# Patient Record
Sex: Female | Born: 1975 | Race: White | Hispanic: No | Marital: Married | Smoking: Never smoker
Health system: Southern US, Community
[De-identification: ages and names within clinical notes are randomized; demographics above are authoritative.]

## PROBLEM LIST (undated history)

## (undated) DIAGNOSIS — Z789 Other specified health status: Secondary | ICD-10-CM

## (undated) HISTORY — PX: TONSILLECTOMY: SUR1361

## (undated) HISTORY — DX: Other specified health status: Z78.9

---

## 2005-11-04 ENCOUNTER — Encounter: Payer: Self-pay | Admitting: Family Medicine

## 2005-11-04 ENCOUNTER — Ambulatory Visit: Payer: Self-pay | Admitting: Family Medicine

## 2006-03-15 ENCOUNTER — Ambulatory Visit: Payer: Self-pay | Admitting: Family Medicine

## 2006-06-22 ENCOUNTER — Ambulatory Visit: Payer: Self-pay | Admitting: Family Medicine

## 2006-08-04 ENCOUNTER — Ambulatory Visit: Payer: Self-pay | Admitting: Internal Medicine

## 2006-08-16 ENCOUNTER — Encounter: Payer: Self-pay | Admitting: Internal Medicine

## 2006-08-16 ENCOUNTER — Ambulatory Visit: Payer: Self-pay

## 2006-10-27 ENCOUNTER — Ambulatory Visit: Payer: Self-pay | Admitting: Family Medicine

## 2007-01-12 ENCOUNTER — Ambulatory Visit: Payer: Self-pay | Admitting: Family Medicine

## 2007-02-15 ENCOUNTER — Ambulatory Visit: Payer: Self-pay | Admitting: Internal Medicine

## 2007-02-15 DIAGNOSIS — J019 Acute sinusitis, unspecified: Secondary | ICD-10-CM | POA: Insufficient documentation

## 2007-02-24 ENCOUNTER — Ambulatory Visit: Payer: Self-pay | Admitting: Family Medicine

## 2007-03-26 ENCOUNTER — Ambulatory Visit: Payer: Self-pay | Admitting: Family Medicine

## 2007-03-26 DIAGNOSIS — J209 Acute bronchitis, unspecified: Secondary | ICD-10-CM | POA: Insufficient documentation

## 2007-03-30 ENCOUNTER — Encounter: Admission: RE | Admit: 2007-03-30 | Discharge: 2007-03-30 | Payer: Self-pay | Admitting: Family Medicine

## 2007-04-02 ENCOUNTER — Encounter (INDEPENDENT_AMBULATORY_CARE_PROVIDER_SITE_OTHER): Payer: Self-pay | Admitting: *Deleted

## 2007-04-02 ENCOUNTER — Telehealth (INDEPENDENT_AMBULATORY_CARE_PROVIDER_SITE_OTHER): Payer: Self-pay | Admitting: *Deleted

## 2007-07-24 ENCOUNTER — Telehealth (INDEPENDENT_AMBULATORY_CARE_PROVIDER_SITE_OTHER): Payer: Self-pay | Admitting: *Deleted

## 2007-07-25 ENCOUNTER — Ambulatory Visit: Payer: Self-pay | Admitting: Internal Medicine

## 2007-07-25 DIAGNOSIS — K5289 Other specified noninfective gastroenteritis and colitis: Secondary | ICD-10-CM | POA: Insufficient documentation

## 2007-07-25 LAB — CONVERTED CEMR LAB
Beta hcg, urine, semiquantitative: NEGATIVE
Bilirubin Urine: NEGATIVE
Glucose, Urine, Semiquant: NEGATIVE
Specific Gravity, Urine: >=1.03
Urobilinogen, UA: NEGATIVE
pH: 5

## 2007-07-26 ENCOUNTER — Encounter: Payer: Self-pay | Admitting: Internal Medicine

## 2008-04-22 ENCOUNTER — Ambulatory Visit: Payer: Self-pay | Admitting: Family Medicine

## 2008-04-22 LAB — CONVERTED CEMR LAB: Beta hcg, urine, semiquantitative: POSITIVE

## 2008-12-30 ENCOUNTER — Inpatient Hospital Stay (HOSPITAL_COMMUNITY): Admission: AD | Admit: 2008-12-30 | Discharge: 2009-01-02 | Payer: Self-pay | Admitting: Obstetrics and Gynecology

## 2008-12-30 ENCOUNTER — Encounter (INDEPENDENT_AMBULATORY_CARE_PROVIDER_SITE_OTHER): Payer: Self-pay | Admitting: Obstetrics and Gynecology

## 2010-08-05 ENCOUNTER — Inpatient Hospital Stay (HOSPITAL_COMMUNITY): Admission: AD | Admit: 2010-08-05 | Payer: Self-pay | Admitting: Obstetrics and Gynecology

## 2010-08-11 ENCOUNTER — Inpatient Hospital Stay (HOSPITAL_COMMUNITY)
Admission: RE | Admit: 2010-08-11 | Discharge: 2010-08-14 | Payer: Self-pay | Source: Home / Self Care | Attending: Obstetrics and Gynecology | Admitting: Obstetrics and Gynecology

## 2010-09-24 NOTE — Discharge Summary (Signed)
  [name], [name]              ACCOUNT NO.:  [id number]  MEDICAL RECORD NO.:  [id number]          PATIENT TYPE:  INP  LOCATION:  9103                          [facility]:  [facility]  PHYSICIAN:  Roxy Filler L. Jhoselin Crume, M.D.DATE OF BIRTH:  Mar 06, 1976  DATE OF ADMISSION:  08/11/2010 DATE OF DISCHARGE:  08/13/2010                              DISCHARGE SUMMARY   ADMITTING DIAGNOSES: 1. Intrauterine pregnancy at 19 weeks' estimated gestational age. 2. Previous cesarean section.  DISCHARGE DIAGNOSES: 1. Status post low transverse cesarean section. 2. Viable female infant.  PROCEDURE:  Repeat low transverse cesarean section.  REASON FOR ADMISSION:  Please see written H and P.  HOSPITAL COURSE:  The patient is a 35 year old gravida 2, para 1, who was admitted to [facility] at 39 plus weeks' estimated gestational age for scheduled cesarean section.  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  On the morning of admission, the patient was taken to the operating room where spinal anesthesia was administered without difficulty.  Low transverse incision was made with a delivery of a viable female infant weighing 6 pounds 12 ounces with Apgars of 9 at 1 minute and 9 at 5 minutes.  The patient tolerated procedure well, was taken to recovery room in stable condition.  On postoperative day #1, the patient was without complaint. Vital signs were stable.  Abdomen soft with good return of bowel function.  Fundus was firm and nontender.  Incision was clean, dry, and intact with subcuticular closure and Dermabond.  Laboratories were pending.  On postoperative day #2, the patient was considering early discharge.  Vital signs were stable.  She was afebrile.  Abdomen soft. Fundus firm and nontender.  Incision was clean, dry, and intact. Laboratory findings revealed hemoglobin of 10.9.  The patient was  noted to be Rh negative and she had received RhoGAM.  DISCHARGE INSTRUCTIONS:  Were reviewed and patient was later discharged home.  CONDITION ON DISCHARGE:  Stable.  DIET:  Regular as tolerated.  ACTIVITY:  No heavy lifting, no driving x2 weeks, no vaginal entry.  FOLLOWUP:  Patient to follow up in the office in 1-2 weeks for an incision check.  She is to call for temperature greater than 100 degrees, persistent nausea, vomiting, heavy vaginal bleeding, and/or redness or drainage from the incisional site.  DISCHARGE MEDICATIONS: 1. Tylox #30 one p.o. every 4-6 hours p.r.n. 2. Motrin 600 mg every 6 hours. 3. Prenatal vitamins one p.o. daily. 4. Colace 1 p.o. daily p.r.n.     Julio Sicks, N.P.   ______________________________ Stann Mainland. Vincente Poli, M.D.    CC/MEDQ  D:  09/07/2010  T:  09/08/2010  Job:  [id number]  Electronically Signed by Julio Sicks N.P. on 09/08/2010 11:58:56 AM Electronically Signed by Marcelle Overlie M.D. on 09/24/2010 08:43:21 AM

## 2010-11-16 LAB — CBC
HCT: 34.9 % — ABNORMAL LOW (ref 36.0–46.0)
Hemoglobin: 12.1 g/dL (ref 12.0–15.0)
MCH: 33.8 pg (ref 26.0–34.0)
MCHC: 34.7 g/dL (ref 30.0–36.0)
MCV: 96.8 fL (ref 78.0–100.0)
MCV: 98 fL (ref 78.0–100.0)
Platelets: 144 10*3/uL — ABNORMAL LOW (ref 150–400)
RDW: 13.7 % (ref 11.5–15.5)

## 2010-11-16 LAB — COMPREHENSIVE METABOLIC PANEL
ALT: 15 U/L (ref 0–35)
AST: 20 U/L (ref 0–37)
CO2: 24 mEq/L (ref 19–32)
Chloride: 105 mEq/L (ref 96–112)
Creatinine, Ser: 0.8 mg/dL (ref 0.4–1.2)
GFR calc Af Amer: >60 mL/min (ref >60)
GFR calc non Af Amer: >60 mL/min (ref >60)
Sodium: 137 mEq/L (ref 135–145)
Total Bilirubin: 0.3 mg/dL (ref 0.3–1.2)

## 2010-11-16 LAB — ABO/RH: ABO/RH(D): A NEG

## 2010-11-16 LAB — RH IMMUNE GLOB WKUP(>/=20WKS)(NOT WOMEN'S HOSP)

## 2010-12-15 LAB — RH IMMUNE GLOB WKUP(>/=20WKS)(NOT WOMEN'S HOSP)

## 2010-12-15 LAB — CBC
HCT: 27.4 % — ABNORMAL LOW (ref 36.0–46.0)
Hemoglobin: 12.4 g/dL (ref 12.0–15.0)
MCHC: 35.4 g/dL (ref 30.0–36.0)
MCV: 96.9 fL (ref 78.0–100.0)
Platelets: 135 10*3/uL — ABNORMAL LOW (ref 150–400)
RBC: 2.58 MIL/uL — ABNORMAL LOW (ref 3.87–5.11)
RBC: 2.83 MIL/uL — ABNORMAL LOW (ref 3.87–5.11)
RBC: 3.6 MIL/uL — ABNORMAL LOW (ref 3.87–5.11)
RDW: 12.7 % (ref 11.5–15.5)
WBC: 13.1 10*3/uL — ABNORMAL HIGH (ref 4.0–10.5)

## 2011-01-18 NOTE — Op Note (Signed)
NAMEJONATHAN, KIRKENDOLL              ACCOUNT NO.:  [id number]   MEDICAL RECORD NO.:  [id number]          PATIENT TYPE:  INP   LOCATION:  9145                          [facility]   PHYSICIAN:  Michelle L. Grewal, M.D.DATE OF BIRTH:  Dec 09, 1975   DATE OF PROCEDURE:  12/30/2008  DATE OF DISCHARGE:                               OPERATIVE REPORT   PREOPERATIVE DIAGNOSES:  1. Intrauterine pregnancy at 41 weeks.  2. Arrest to dilation is 6 cm in transverse position.   POSTOPERATIVE DIAGNOSES:  1. Intrauterine pregnancy at 41 weeks.  2. Arrest to dilation is 6 cm in transverse position.   PROCEDURE:  Primary low transverse cesarean section.   SURGEON:  Michelle L. Grewal, MD   ANESTHESIA:  Epidural.   FINDINGS:  Female infant, Apgars 9 at 1 minute and 9 at 5 minutes in left  occipitotransverse position.   ESTIMATED BLOOD LOSS:  500 mL.   COMPLICATIONS:  None.   PROCEDURE:  The patient was taken to the operating room.  After she had  been consented for the known risk associated with the procedure, her  husband had been counseled as well.  All questions were answered.  After  she was taken to the operating room, she was dosed by Anesthesia and  found to be adequate.  She was prepped and draped in the usual sterile  fashion.  A Foley catheter had been inserted while in labor and  delivery.  A low transverse incision was made in the skin and carried  down to the fascia.  Fascia was scored in the midline and extended  laterally.  The rectus muscles were separated in the midline and the  peritoneum was entered bluntly and then the peritoneal incision was then  stretched.  The bladder blade was inserted.  The lower uterine segment  was identified and the bladder flap was created sharply and then  digitally and the bladder blade was readjusted.  A low transverse  incision was made in the uterus.  The uterus was entered using a  hemostat.  The baby was in LOT position, and the baby was  delivered  easily with one gentle pull of the vacuum.  The baby was a female infant,  vigorous on the abdomen, Apgars 9 at 1 minute and 9 at 5 minutes.  There  was a tight shoulder cord noted as well.  I thought this as umbilical  cord, appeared very long and thin as well.  The cord was clamped and  cut.  The baby was handed to the awaiting neonatal team and subsequently  taken to the nursery.  The placenta was manually removed and noted to be  normal and intact with a 3-vessel cord.  The Pitocin and antibiotics  were given.  The uterus was cleared of all clots and debris.  The  uterine incision was closed in 1 layer using 0 chromic in a running  locked stitch.  The uterus was returned to the abdomen.  Irrigation was  performed.  The peritoneum and rectus muscles were reapproximated using  0 Vicryl.  The fascia was closed using  0 Vicryl in running stitch.  After irrigation of subcutaneous layer, the skin was closed with  staples.  All sponge, lap, and instrument counts were correct x2.  The  patient went to recovery room in stable condition.      Michelle L. Vincente Poli, M.D.  Electronically Signed     MLG/MEDQ  D:  12/30/2008  T:  12/31/2008  Job:  [id number]

## 2011-01-20 ENCOUNTER — Encounter: Payer: Self-pay | Admitting: Family Medicine

## 2011-01-20 ENCOUNTER — Ambulatory Visit: Payer: Self-pay | Admitting: Family Medicine

## 2011-01-21 NOTE — Group Therapy Note (Signed)
NAME:  Beverly Banks, Beverly Banks              ACCOUNT NO.:  [id number]   MEDICAL RECORD NO.:  [id number]          PATIENT TYPE:  WOC   LOCATION:  [facility]                   FACILITY:  [facility]   PHYSICIAN:  Tinnie Gens, MD        DATE OF BIRTH:  [birth year]   DATE OF SERVICE:                                    CLINIC NOTE   CHIEF COMPLAINT:  Annual exam.   HISTORY OF PRESENT ILLNESS:  Patient is a 35 year old gravida 0, who was  recently married and tried to conceive sometime in the summer.  She works as  an Product/process development scientist currently and is without significant complaint today.  She states  that she has regular cycles.  She has been using condoms for birth control  previously.   PAST MEDICAL HISTORY:  Significant for hepatitis A as a child and pneumonia.   SURGICAL HISTORY:  Negative.   MEDICATIONS:  None.   ALLERGIES:  1.  NO KNOWN DRUG ALLERGIES.  2.  SHE IS ALLERGIC TO CUCUMBERS AND ORANGES, AND CHOCOLATE AND COFFEE,      WHICH ALL GIVE HER A RASH.   OBSTETRIC HISTORY:  She is a G0.   GYNECOLOGIC HISTORY:  Menarche at age 65, cycles are every month, last 3-4  days, medium flow and mild cramping the first day.  She has never had a  pelvic exam before and has no Pap smear history.  She has no STD history  although her husband has HSV and is on prophylaxis for outbreaks.   FAMILY HISTORY:  Significant for colon, breast and lung cancer, which is all  on the maternal side of the family.  She believes her mother recently had  ovarian cancer, as well.  Her family is in [place].   SOCIAL HISTORY:  She is married.  She has a 2-1/2-year-old stepson, who  lives with her.  She denies tobacco or alcohol use.   REVIEW OF SYSTEMS:  A 14-point review of systems is reviewed.  See GYN  history in the chart, but is negative except for pain with intercourse,  which she described as just being pain in certain positions and only with  deep penetration.  It is quick goes away quickly, as well.   PHYSICAL  EXAMINATION:  VITAL SIGNS:  On exam today, her blood pressure is  105/61 and weight is 127.  GENERAL: She is a well-developed, well-nourished white female in no acute  distress.  NECK:  Supple with normal thyroid.  LUNGS:  Clear bilaterally.  CARDIOVASCULAR:  Regular rate and rhythm with no rubs, gallops or murmurs.  ABDOMEN:  Soft, nontender and nondistended.  BREASTS:  Symmetric with everted nipples.  She has fibrocystic change in the  outer quadrants bilaterally, but no definitive mass.  There is no  supraclavicular or axillary adenopathy.  GENITOURINARY:  Normal external female genitalia.  BUS is normal.  The  vagina is pink and rugated.  The cervix is nulliparous and without lesion.  The uterus was small and anteverted.  The adnexa were without mass or  tenderness.   IMPRESSION:  Annual exam.   PLAN:  1.  Pap smear today.  2.  GC and Chlamydia testing.  3.  The patient will call us when and if she becomes pregnant, or if we can      do anything else for her GYN care.  Otherwise, follow up will be in 1      year for another Pap smear.           ______________________________  Tinnie Gens, MD     TP/MEDQ  D:  11/04/2005  T:  11/05/2005  Job:  [id number]

## 2011-01-21 NOTE — Discharge Summary (Signed)
Beverly Banks, Beverly Banks              ACCOUNT NO.:  [id number]   MEDICAL RECORD NO.:  [id number]          PATIENT TYPE:  INP   LOCATION:  9145                          [facility]   PHYSICIAN:  Freddy Finner, M.D.   DATE OF BIRTH:  1976-05-11   DATE OF ADMISSION:  12/30/2008  DATE OF DISCHARGE:  01/02/2009                               DISCHARGE SUMMARY   ADMITTING DIAGNOSES:  1. Intrauterine pregnancy at 40+ weeks estimated gestational age.  2. Postdate induction of labor.   DISCHARGE DIAGNOSES:  1. Status post low transverse cesarean section secondary to failure to      progress.  2. Viable female infant.   PROCEDURE:  Primary low transverse cesarean section.   REASON FOR ADMISSION:  Please see written H&P.   HOSPITAL COURSE:  The patient is a 35 year old primigravida was admitted  to [facility] for postdate induction of labor.  On  admission, vital signs were stable.  Contractions were noted to be  irregular on admission.  Group B beta strep culture was negative and the  patient was known to be Rh negative.  Fetal heart tones were reactive.  Cervix was examined and found to be 2-1/2 cm dilated, 80% effaced,  vertex at a -2 station.  Artificial rupture of membranes was performed  which revealed clear fluid.  The patient was now started on Pitocin to  augment her labor and epidural was placed for her comfort.  Over the  next several hours, the patient was noted to be in a good pattern of  labor.  Intrauterine pressure catheter had revealed adequate uterine  contractions.  However, cervix continued to be 5-6 cm without further  change in cervix.  Decision was then made to proceed with a primary low  transverse cesarean section.  The patient was then transferred to the  operating room where an epidural was dosed to an adequate surgical  level.  A low transverse incision was made with delivery of a viable  female infant with Apgars of 9 at 1 minute and 9 at 5  minutes.  The  patient tolerated the procedure well and was taken to the recovery room  in stable condition.  On postoperative day #1, the patient was without  complaint.  Vital signs were stable.  Temperature max was 100.1 on the  evening of her surgery.  Temperature currently at the time of rounding  was 99.  Abdomen soft with some decrease in bowel sounds.  Fundus was  firm.  Abdominal dressing noted to have a scant amount of drainage on  the bandage.  Foley was draining with adequate amounts of urine output.  Laboratory findings revealed hemoglobin of 9.7 and WBC count of 13.1.  on postoperative day #2, the patient was without complaint.  Vital signs  were stable.  She was afebrile.  Fundus firm and nontender.  Incision  was noted to have some ecchymosis noted superior and inferior to the  incisional site.  Laboratory findings revealed hemoglobin of 8.9,  platelet count of 135,000, WBC count of 10.8.  On postoperative day #3,  the  patient was without complaint.  Vital signs were stable.  She was  afebrile.  Abdomen was soft.  Fundus firm and nontender.  Incision was  noted to have a small amount of oozing noted from the left margin of the  incision.  Ecchymosis continued to be noted superior and inferior to the  incisional site.  Steri-Strips were applied.  Staples were left in  place.  Discharge instructions were reviewed and the patient was later  discharged to home.   CONDITION ON DISCHARGE:  Stable.   DIET:  Regular as tolerated.   ACTIVITY:  No heavy lifting, no driving x2 weeks, no vaginal entry.   FOLLOWUP:  The patient to follow up in the office in 2-3 days for staple  removal.  She is to call for temperature greater than 100 degrees,  persistent nausea, vomiting, heavy vaginal bleeding and/or redness or  further drainage from the incisional site.   DISCHARGE MEDICATIONS:  1. Percocet 5/325, #30 one p.o. every 4-6 hours p.r.n.  2. Motrin 600 mg every 6 hours.  3.  Prenatal vitamins 1 p.o. daily.      Julio Sicks, N.P.      Freddy Finner, M.D.  Electronically Signed    CC/MEDQ  D:  01/14/2009  T:  01/14/2009  Job:  [id number]

## 2011-01-24 ENCOUNTER — Ambulatory Visit (INDEPENDENT_AMBULATORY_CARE_PROVIDER_SITE_OTHER): Payer: No Typology Code available for payment source | Admitting: Family Medicine

## 2011-01-24 ENCOUNTER — Encounter: Payer: Self-pay | Admitting: Family Medicine

## 2011-01-24 VITALS — BP 90/60 | HR 74 | Temp 99.0°F | Wt 134.0 lb

## 2011-01-24 DIAGNOSIS — B351 Tinea unguium: Secondary | ICD-10-CM

## 2011-01-24 NOTE — Progress Notes (Signed)
  Subjective:    Patient ID: Beverly Banks, female    DOB: August 25, 1976, 35 y.o.   MRN: [id number]  HPI Pt here c/o fungus on R 3rd finger.  No other complaints.     Review of Systems As above    Objective:   Physical Exam  Constitutional: She is oriented to person, place, and time. She appears well-developed and well-nourished.  Musculoskeletal:       Right 3rd middle finger---nail deformed and thick  Neurological: She is alert and oriented to person, place, and time.  Psychiatric: She has a normal mood and affect. Her behavior is normal.          Assessment & Plan:

## 2011-01-24 NOTE — Patient Instructions (Signed)
Ringworm - Nail (Tinea Unguium/Onychomycosis) A fungal infection of the nail (tinea unguium/onychomycosis) is common. It is common as the visible part of the nail is composed of dead cells which have no blood supply to help prevent infection. It occurs because fungi are everywhere and will pick any opportunity to grow on any dead material. Because nails are very slow growing they require up to 2 years of treatment with anti-fungal medications. The entire nail back to the base is infected. This includes approximately ? of the nail which you cannot even see. If your caregiver has prescribed a medication by mouth, take it every day and as directed. No progress will be seen for at least 6 to 9 months. Do not be disappointed! Because fungi live on dead cells with little or no exposure to blood supply, medication delivery to the infection is slow; thus the cure is slow. It is also why you can observe no progress in the first 6 months. The nail becoming cured is the base of the nail, as it has the blood supply. Topical medication such as creams and ointments are usually not effective. Important in successful treatment of nail fungus is closely following the medication regimen that your doctor prescribes. Sometimes you and your caregiver may elect to speed up this process by surgical removal of all the nails. Even this may still require 6 to 9 months of additional oral medications. See your caregiver as directed. Remember there will be no visible improvement for at least 6 months. See your caregiver sooner if other signs of infection (redness and swelling) develop. Document Released: 08/19/2000 Document Re-Released: 02/09/2010 [employer] Patient Information 2011 [employer]. 

## 2011-01-24 NOTE — Assessment & Plan Note (Signed)
Unable to treat secondary to pt breast feeding rto when she is finished breast feeding and we can discuss options

## 2011-04-06 ENCOUNTER — Telehealth: Payer: Self-pay | Admitting: *Deleted

## 2011-04-06 ENCOUNTER — Encounter: Payer: Self-pay | Admitting: Family Medicine

## 2011-04-06 ENCOUNTER — Ambulatory Visit (INDEPENDENT_AMBULATORY_CARE_PROVIDER_SITE_OTHER): Payer: No Typology Code available for payment source | Admitting: Family Medicine

## 2011-04-06 VITALS — BP 102/60 | HR 63 | Temp 98.3°F | Wt 132.8 lb

## 2011-04-06 DIAGNOSIS — L03019 Cellulitis of unspecified finger: Secondary | ICD-10-CM

## 2011-04-06 DIAGNOSIS — IMO0001 Reserved for inherently not codable concepts without codable children: Secondary | ICD-10-CM | POA: Insufficient documentation

## 2011-04-06 MED ORDER — VALACYCLOVIR HCL 1 G PO TABS: 1000.0000 mg | ORAL_TABLET | Freq: Every day | ORAL | Status: AC

## 2011-04-06 MED ORDER — AMOXICILLIN 500 MG PO CAPS: 500.0000 mg | ORAL_CAPSULE | Freq: Two times a day (BID) | ORAL | Status: AC

## 2011-04-06 NOTE — Telephone Encounter (Signed)
Discuss with patient, appt schedule for today.

## 2011-04-06 NOTE — Patient Instructions (Signed)
This is an infection- take the Amoxicillin as directed, take w/ food to avoid upset stomach Try and keep your hands clean and dry You can do Epsom Salt soaks in warm water for 15-20 minutes 3x/day Someone will call you with your derm appt Hang in there!

## 2011-04-06 NOTE — Assessment & Plan Note (Signed)
Pt still w/ fungal infxn of nail but now has paronychia due to split nail bed.  Start Amox since pt is breast feeding.  Will refer to derm for tx of nail fungus.  Reviewed supportive care and red flags that should prompt return.  Pt expressed understanding and is in agreement w/ plan.

## 2011-04-06 NOTE — Telephone Encounter (Signed)
Pt still c/o redness, nail turning a yellowish color, tenderness and pain around nail base. Pt denies any drainage,or fever. Pt notes that she is currently breast feeding so any med Rx will need to be approved with her pediatrician first. Pt seen on 01-24-11 for nail fungus.Please advise    Pt uses [facility].

## 2011-04-06 NOTE — Telephone Encounter (Signed)
If there is pain and redness around the nail bed it sounds as if she now has bacterial infxn- nail fungus is usually painless.  Needs to be seen for this so that it can be treated appropriately- these often need to be drained

## 2011-04-06 NOTE — Progress Notes (Signed)
  Subjective:    Patient ID: Beverly Banks, female    DOB: 05/14/1976, 35 y.o.   MRN: [id number]  HPI R middle finger pain- sxs started a couple of months ago w/ an 'indentation of the nail'.  Was dx'd w/ fungal infxn but was not treated b/c of breast feeding.  2 days ago area became red and painful.  Pt thinks there is pus- no drainage from finger.   Review of Systems For ROS see HPI     Objective:   Physical Exam  Vitals reviewed. Constitutional: She appears well-developed and well-nourished. No distress.  Skin: Skin is warm. There is erythema.       R middle finger w/ erythema and induration around base of nail bed, pus expressed from ulnar nail margin.          Assessment & Plan:

## 2011-10-13 ENCOUNTER — Encounter: Payer: Self-pay | Admitting: Family Medicine

## 2011-10-13 ENCOUNTER — Ambulatory Visit (INDEPENDENT_AMBULATORY_CARE_PROVIDER_SITE_OTHER): Payer: Managed Care, Other (non HMO) | Admitting: Family Medicine

## 2011-10-13 VITALS — BP 96/62 | HR 86 | Temp 98.8°F | Wt 133.6 lb

## 2011-10-13 DIAGNOSIS — J329 Chronic sinusitis, unspecified: Secondary | ICD-10-CM

## 2011-10-13 MED ORDER — BUDESONIDE 32 MCG/ACT NA SUSP: NASAL | Status: DC

## 2011-10-13 MED ORDER — AMOXICILLIN 875 MG PO TABS: 875.0000 mg | ORAL_TABLET | Freq: Two times a day (BID) | ORAL | Status: DC

## 2011-10-13 NOTE — Progress Notes (Signed)
  Subjective:     SALLEE HOGREFE is a 36 y.o. female who presents for evaluation of sinus pain. Symptoms include: congestion, cough, facial pain, nasal congestion, post nasal drip, sinus pressure and chills. Onset of symptoms was 4 days ago. Symptoms have been gradually worsening since that time. Past history is significant for no history of pneumonia or bronchitis. Patient is a non-smoker. She is breast feeding.    The following portions of the patient's history were reviewed and updated as appropriate: allergies, current medications, past family history, past medical history, past social history, past surgical history and problem list.  Review of Systems Pertinent items are noted in HPI.   Objective:    BP 96/62  Pulse 86  Temp(Src) 98.8 F (37.1 C) (Oral)  Wt 133 lb 9.6 oz (60.601 kg)  SpO2 98% General appearance: alert, cooperative, appears stated age and moderate distress Ears: tm dull b/l Nose: green discharge, moderate congestion, turbinates red, swollen, edematous, sinus tenderness bilateral Throat: abnormal findings: mild oropharyngeal erythema Neck: no adenopathy, supple, symmetrical, trachea midline and thyroid not enlarged, symmetric, no tenderness/mass/nodules Lungs: clear to auscultation bilaterally    Assessment:    Acute bacterial sinusitis.    Plan:    Nasal steroids per medication orders. Antihistamines per medication orders. Amoxicillin per medication orders. claritin and rhinocort

## 2011-10-13 NOTE — Patient Instructions (Signed)

## 2011-10-17 ENCOUNTER — Other Ambulatory Visit: Payer: Self-pay | Admitting: *Deleted

## 2011-10-17 ENCOUNTER — Telehealth: Payer: Self-pay | Admitting: Family Medicine

## 2011-10-17 MED ORDER — SULFAMETHOXAZOLE-TRIMETHOPRIM 800-160 MG PO TABS: 1.0000 | ORAL_TABLET | Freq: Two times a day (BID) | ORAL | Status: AC

## 2011-10-17 MED ORDER — FEXOFENADINE HCL 180 MG PO TABS: 180.0000 mg | ORAL_TABLET | Freq: Every day | ORAL | Status: DC

## 2011-10-17 NOTE — Telephone Encounter (Signed)
[facility] Triage Call Report Triage Record Num: [id number] Operator: Tomasita Crumble Patient Name: Beverly Banks Call Date & Time: 10/17/2011 9:57:35AM Patient Phone: [phone] PCP: Lelon Perla Patient Gender: Female PCP Fax : [phone] Patient DOB: 1975/12/04 Practice Name: [facility] Reason for Call: Caller: Beverly Banks/Patient; PCP: Lelon Perla.; CB#: [id number]; Call regarding Nasal Congestion; onset 10/10/11 w/ right earache. Afebrile/subjective. Nasal drainage white; does not feel that her nasal sx have improved and she still has ear stuffiness. Advised see in 24 hours per Allergies protocol. Caller agreed to appointment, but states she needs to arrange child care. She will call scheduler for appontment. Protocol(s) Used: Allergies Recommended Outcome per Protocol: See Provider within 24 hours Reason for Outcome: Being treated by provider for allergies AND symptoms not responding or are worsening with prescribed treatment in expected timeframe Care Advice: ~ Continue taking prescribed medications as directed until provider is consulted. Avoid being outdoors during periods of high pollution or high pollen counts. Stay in a filtered, air conditioned environment. ~ Use an air purifier, clean or change filters on heating/cooling units at least monthly. Maintaining humidity in home below 50%, helps prevent dust mites and mold. ~ ~ Contact provider during regular office hours to discuss treatment plan. Avoid exposure to environmental irritants. Do not smoke and avoid second-hand smoke. Avoid outside activities on high pollution days. Instead of strong smelling commercial cleaning products, substitute vinegar and lemon juice. ~ ~ CAUTIONS Most adults need to drink 6-10 eight-ounce glasses (1.2-2.0 liters) of fluids per day unless previously told to limit fluid intake for other medical reasons. Limit fluids that contain caffeine, sugar or alcohol. Urine  will be a very light yellow color when you drink enough fluids.

## 2011-10-17 NOTE — Telephone Encounter (Signed)
Rx faxed and patient is aware    KP

## 2011-10-17 NOTE — Telephone Encounter (Signed)
Discussed with patient and Rx has been faxed   KP 

## 2011-10-17 NOTE — Telephone Encounter (Signed)
PT left VM that she would like to get refill on Allegra. Med not on med list and never Rx in system .Please advise

## 2011-10-17 NOTE — Telephone Encounter (Signed)
Allegra 180 mg #30, 1 po qd 1 1 refils

## 2011-10-17 NOTE — Telephone Encounter (Signed)
Change to bactrim ds  1 po bid for 10 days---ov if no better

## 2011-10-18 ENCOUNTER — Ambulatory Visit: Payer: Managed Care, Other (non HMO) | Admitting: Family Medicine

## 2011-12-15 ENCOUNTER — Other Ambulatory Visit: Payer: Self-pay

## 2012-01-09 ENCOUNTER — Ambulatory Visit (INDEPENDENT_AMBULATORY_CARE_PROVIDER_SITE_OTHER): Payer: Managed Care, Other (non HMO) | Admitting: Family Medicine

## 2012-01-09 ENCOUNTER — Encounter: Payer: Self-pay | Admitting: Family Medicine

## 2012-01-09 VITALS — BP 100/68 | HR 85 | Temp 98.1°F | Wt 133.0 lb

## 2012-01-09 DIAGNOSIS — H6690 Otitis media, unspecified, unspecified ear: Secondary | ICD-10-CM

## 2012-01-09 DIAGNOSIS — H6691 Otitis media, unspecified, right ear: Secondary | ICD-10-CM

## 2012-01-09 MED ORDER — SULFAMETHOXAZOLE-TRIMETHOPRIM 800-160 MG PO TABS: 1.0000 | ORAL_TABLET | Freq: Two times a day (BID) | ORAL | Status: DC

## 2012-01-09 NOTE — Progress Notes (Signed)
  Subjective:     Beverly Banks is a 36 y.o. female who presents with ear pain and possible ear infection. Symptoms include: bilateral ear pain, congestion and plugged sensation in both ears. Onset of symptoms was 4 days ago, and have been gradually worsening since that time. Associated symptoms include: congestion, productive cough and sinus pressure.  Patient denies: achiness, chills, fever , headache and low grade fever. She is drinking plenty of fluids.  Taking Tussin DM , afrin and IB with little relief.   The following portions of the patient's history were reviewed and updated as appropriate: allergies, current medications, past family history, past medical history, past social history, past surgical history and problem list.  Review of Systems Pertinent items are noted in HPI.   Objective:    BP 100/68  Pulse 85  Temp(Src) 98.1 F (36.7 C) (Oral)  Wt 133 lb (60.328 kg)  SpO2 98% General:  alert, cooperative, appears stated age and no distress  Right Ear: diminished mobility-  + errythema  Left Ear: normal  Mouth:  abnormal findings: mild oropharyngeal erythema and pnd  Neck: no adenopathy, supple, symmetrical, trachea midline and thyroid not enlarged, symmetric, no tenderness/mass/nodules     Assessment:    Right acute otitis media   Plan:    Treatment: bactrim ds  OTC analgesia as needed. Fluids, rest, avoid carbonated/alcoholic and caffeinated beverages.  Follow up in a few days if not improving.

## 2012-01-09 NOTE — Patient Instructions (Signed)
Otitis Media, Adult  A middle ear infection is an infection in the space behind the eardrum. The medical name for this is "otitis media." It may happen after a common cold. It is caused by a germ that starts growing in that space. You may feel swollen glands in your neck on the side of the ear infection.  HOME CARE INSTRUCTIONS   · Take your medicine as directed until it is gone, even if you feel better after the first few days.  · Only take over-the-counter or prescription medicines for pain, discomfort, or fever as directed by your caregiver.  · Occasional use of a nasal decongestant a couple times per day may help with discomfort and help the eustachian tube to drain better.  Follow up with your caregiver in 10 to 14 days or as directed, to be certain that the infection has cleared. Not keeping the appointment could result in a chronic or permanent injury, pain, hearing loss and disability. If there is any problem keeping the appointment, you must call back to this facility for assistance.  SEEK IMMEDIATE MEDICAL CARE IF:   · You are not getting better in 2 to 3 days.  · You have pain that is not controlled with medication.  · You feel worse instead of better.  · You cannot use the medication as directed.  · You develop swelling, redness or pain around the ear or stiffness in your neck.  MAKE SURE YOU:   · Understand these instructions.  · Will watch your condition.  · Will get help right away if you are not doing well or get worse.  Document Released: 05/27/2004 Document Revised: 08/11/2011 Document Reviewed: 03/28/2008  ExitCare® Patient Information ©2012 [employer].

## 2012-01-19 ENCOUNTER — Telehealth: Payer: Self-pay | Admitting: Family Medicine

## 2012-01-19 NOTE — Telephone Encounter (Signed)
Caller: Beverly Banks/Patient; PCP: Lelon Perla.; CB#: [phone];  Call regarding Ear Sx- just finiished Antibiotic on 01/18/12 for ear infection and still  feels like fluid in ear; Making popping noises and fluid present in the mornings ( feels wet)- onset past 3-4 days. She cannot hear well out of that ear. No inner ear pain but has some tenderness if she pulls on outer ear. Afebrile. Triage and Care advice per Ear Heaing Change and Ear Sx Protocols and appnt advised within 24 hours. She is wanting to see Dr. Laury Axon- appnt are full for today 01/19/12. She will call back on 01/20/12 for appnt in the afternoon if possible.

## 2012-01-19 NOTE — Telephone Encounter (Signed)
appt wt/tabori 11:15am tomorrow

## 2012-01-19 NOTE — Telephone Encounter (Signed)
Can we get pt in tomorrow?

## 2012-01-20 ENCOUNTER — Encounter: Payer: Self-pay | Admitting: Family Medicine

## 2012-01-20 ENCOUNTER — Ambulatory Visit (INDEPENDENT_AMBULATORY_CARE_PROVIDER_SITE_OTHER): Payer: Managed Care, Other (non HMO) | Admitting: Family Medicine

## 2012-01-20 VITALS — BP 125/75 | HR 78 | Temp 98.6°F | Ht 66.5 in | Wt 133.6 lb

## 2012-01-20 DIAGNOSIS — H6980 Other specified disorders of Eustachian tube, unspecified ear: Secondary | ICD-10-CM

## 2012-01-20 DIAGNOSIS — H6990 Unspecified Eustachian tube disorder, unspecified ear: Secondary | ICD-10-CM | POA: Insufficient documentation

## 2012-01-20 NOTE — Progress Notes (Signed)
  Subjective:    Patient ID: Beverly Banks, female    DOB: 12/29/1975, 36 y.o.   MRN: [id number]  HPI S/p abx for R OM.  Pain improved 'almost immediately'. But now unable to hear out of R ear.  Ear is popping, sensation of fluid.  sxs started after taking abx.  Very noticeable when chewing or yawning.  No fevers.  Is breastfeeding   Review of Systems For ROS see HPI     Objective:   Physical Exam  Vitals reviewed. Constitutional: She appears well-developed and well-nourished. No distress.  HENT:  Head: Normocephalic and atraumatic.  Mouth/Throat: Oropharynx is clear and moist. No oropharyngeal exudate.       TMs retracted bilaterally, R>L + turbinate edema, R>L No TTP over sinuses  Neck: Normal range of motion.  Lymphadenopathy:    She has no cervical adenopathy.          Assessment & Plan:

## 2012-01-20 NOTE — Patient Instructions (Signed)
This is all due to nasal congestion Start plain Sudafed (as directed on the box)- this is safe in breast feeding Drink plenty of fluids Hang in there!!!

## 2012-01-21 NOTE — Assessment & Plan Note (Signed)
New.  Start Sudafed to decrease congestion and all ear to 'pop'.  Reviewed safety in breast feeding.  Pt expressed understanding and is in agreement w/ plan.

## 2012-06-01 ENCOUNTER — Other Ambulatory Visit: Payer: Self-pay

## 2012-06-05 ENCOUNTER — Other Ambulatory Visit: Payer: Self-pay | Admitting: Obstetrics and Gynecology

## 2013-05-17 ENCOUNTER — Other Ambulatory Visit: Payer: Self-pay

## 2013-11-11 ENCOUNTER — Other Ambulatory Visit: Payer: Self-pay | Admitting: Obstetrics and Gynecology

## 2014-02-05 ENCOUNTER — Ambulatory Visit (INDEPENDENT_AMBULATORY_CARE_PROVIDER_SITE_OTHER): Payer: BC Managed Care – PPO | Admitting: Family Medicine

## 2014-02-05 ENCOUNTER — Encounter: Payer: Self-pay | Admitting: Family Medicine

## 2014-02-05 ENCOUNTER — Ambulatory Visit (HOSPITAL_BASED_OUTPATIENT_CLINIC_OR_DEPARTMENT_OTHER)
Admission: RE | Admit: 2014-02-05 | Discharge: 2014-02-05 | Disposition: A | Discharge status: Home / Self Care | Payer: BC Managed Care – PPO | Source: Ambulatory Visit | Attending: Family Medicine | Admitting: Family Medicine

## 2014-02-05 ENCOUNTER — Telehealth: Payer: Self-pay

## 2014-02-05 VITALS — BP 114/72 | HR 95 | Temp 98.6°F | Wt 141.0 lb

## 2014-02-05 DIAGNOSIS — R079 Chest pain, unspecified: Secondary | ICD-10-CM

## 2014-02-05 LAB — CBC WITH DIFFERENTIAL/PLATELET
BASOS ABS: 0 10*3/uL (ref 0.0–0.1)
Basophils Relative: 0.4 % (ref 0.0–3.0)
EOS ABS: 0 10*3/uL (ref 0.0–0.7)
EOS PCT: 0.9 % (ref 0.0–5.0)
HCT: 36.3 % (ref 36.0–46.0)
Hemoglobin: 12.1 g/dL (ref 12.0–15.0)
LYMPHS ABS: 0.8 10*3/uL (ref 0.7–4.0)
Lymphocytes Relative: 17.9 % (ref 12.0–46.0)
MCHC: 33.5 g/dL (ref 30.0–36.0)
MCV: 87 fl (ref 78.0–100.0)
MONO ABS: 0.6 10*3/uL (ref 0.1–1.0)
Monocytes Relative: 12.2 % — ABNORMAL HIGH (ref 3.0–12.0)
NEUTROS PCT: 68.6 % (ref 43.0–77.0)
Neutro Abs: 3.1 10*3/uL (ref 1.4–7.7)
PLATELETS: 169 10*3/uL (ref 150.0–400.0)
RBC: 4.17 Mil/uL (ref 3.87–5.11)
RDW: 13.7 % (ref 11.5–15.5)
WBC: 4.6 10*3/uL (ref 4.0–10.5)

## 2014-02-05 LAB — BASIC METABOLIC PANEL
BUN: 12 mg/dL (ref 6–23)
CHLORIDE: 106 meq/L (ref 96–112)
CO2: 28 mEq/L (ref 19–32)
CREATININE: 0.6 mg/dL (ref 0.4–1.2)
Calcium: 9.3 mg/dL (ref 8.4–10.5)
GFR: 113.88 mL/min (ref >60.00)
GLUCOSE: 83 mg/dL (ref 70–99)
Potassium: 3.3 mEq/L — ABNORMAL LOW (ref 3.5–5.1)
Sodium: 140 mEq/L (ref 135–145)

## 2014-02-05 LAB — HEPATIC FUNCTION PANEL
ALT: 15 U/L (ref 0–35)
AST: 20 U/L (ref 0–37)
Albumin: 4.4 g/dL (ref 3.5–5.2)
Alkaline Phosphatase: 37 U/L — ABNORMAL LOW (ref 39–117)
BILIRUBIN DIRECT: 0 mg/dL (ref 0.0–0.3)
BILIRUBIN TOTAL: 0.6 mg/dL (ref 0.2–1.2)
Total Protein: 7.2 g/dL (ref 6.0–8.3)

## 2014-02-05 LAB — D-DIMER, QUANTITATIVE: D-Dimer, Quant: 0.27 ug/mL-FEU (ref 0.00–0.48)

## 2014-02-05 MED ORDER — CYCLOBENZAPRINE HCL 10 MG PO TABS: 10.0000 mg | ORAL_TABLET | Freq: Three times a day (TID) | ORAL | Status: DC | PRN

## 2014-02-05 MED ORDER — NAPROXEN 500 MG PO TABS: 500.0000 mg | ORAL_TABLET | Freq: Two times a day (BID) | ORAL | Status: DC

## 2014-02-05 NOTE — Progress Notes (Signed)
  Subjective:    Beverly Banks is a 38 y.o. female who presents for evaluation of chest pain. Onset was 2 hours ago. Symptoms have been unchanged since that time. The patient describes the pain as spasm and does not radiate. Patient rates pain as a 9/10 in intensity. Associated symptoms are: chest pain. Aggravating factors are: deep inspiration. Alleviating factors are: none. Patient's cardiac risk factors are: none. Patient's risk factors for DVT/PE: none. Previous cardiac testing: none.  The following portions of the patient's history were reviewed and updated as appropriate: allergies, current medications, past family history, past medical history, past social history, past surgical history and problem list.  Review of Systems Pertinent items are noted in HPI.    Objective:    BP 114/72  Pulse 95  Temp(Src) 98.6 F (37 C) (Oral)  Wt 141 lb (63.957 kg)  SpO2 99% General appearance: alert, cooperative, appears stated age and no distress Throat: lips, mucosa, and tongue normal; teeth and gums normal Neck: no adenopathy, no carotid bruit, no JVD, supple, symmetrical, trachea midline and thyroid not enlarged, symmetric, no tenderness/mass/nodules Lungs: clear to auscultation bilaterally Heart: S1, S2 normal Extremities: extremities normal, atraumatic, no cyanosis or edema Chest-- + tenderness with palpation either side of sternum Cardiographics ECG: normal sinus rhythm, no blocks or conduction defects, no ischemic changes  Imaging Chest x-ray: not available for review    Assessment:    Chest pain, suspected etiology: costochondritis    Plan:    Patient history and exam consistent with non-cardiac cause of chest pain. Conservative measures indicated. Prescription NSAIDs per medication orders. Worsening signs and symptoms discussed and patient verbalized understanding. cxr and labs ordered

## 2014-02-05 NOTE — Patient Instructions (Signed)

## 2014-02-05 NOTE — Progress Notes (Signed)
Pre visit review using our clinic review tool, if applicable. No additional management support is needed unless otherwise documented below in the visit note. 

## 2014-02-05 NOTE — Telephone Encounter (Signed)
It is most likely muscular--- meds were sent to pharmacy while she was in office--- call back if she gets no relief with them

## 2014-02-05 NOTE — Telephone Encounter (Signed)
Spoke with patient and made her aware that her D-Dimer, Chest X-ray and labs were normal, except the potassim was slightly low, she has agreed to start a potassium supplement. She stated she was unclear to why she had symptoms and would like for Dr.Lowne to advise the reason for her symptoms since all of her labs were noormal. Please advise     KP

## 2014-02-06 NOTE — Telephone Encounter (Signed)
MSG left to cal the office.      KP 

## 2014-02-06 NOTE — Telephone Encounter (Signed)
Patient has been made aware and she said it popped last night and it feels much better.       KP

## 2014-11-03 ENCOUNTER — Encounter: Payer: Self-pay | Admitting: Family Medicine

## 2014-11-03 ENCOUNTER — Ambulatory Visit (INDEPENDENT_AMBULATORY_CARE_PROVIDER_SITE_OTHER): Payer: BLUE CROSS/BLUE SHIELD | Admitting: Family Medicine

## 2014-11-03 VITALS — BP 110/70 | HR 82 | Temp 98.4°F | Wt 144.0 lb

## 2014-11-03 DIAGNOSIS — R509 Fever, unspecified: Secondary | ICD-10-CM

## 2014-11-03 DIAGNOSIS — J1189 Influenza due to unidentified influenza virus with other manifestations: Secondary | ICD-10-CM

## 2014-11-03 DIAGNOSIS — J111 Influenza due to unidentified influenza virus with other respiratory manifestations: Secondary | ICD-10-CM

## 2014-11-03 MED ORDER — OSELTAMIVIR PHOSPHATE 75 MG PO CAPS: 75.0000 mg | ORAL_CAPSULE | Freq: Two times a day (BID) | ORAL | Status: DC

## 2014-11-03 MED ORDER — LORATADINE 10 MG PO TABS: 10.0000 mg | ORAL_TABLET | Freq: Every day | ORAL | Status: DC

## 2014-11-03 MED ORDER — FLUTICASONE PROPIONATE 50 MCG/ACT NA SUSP: 2.0000 | Freq: Every day | NASAL | Status: DC

## 2014-11-03 NOTE — Progress Notes (Signed)
Subjective:    Patient ID: Beverly Banks, female    DOB: 1975-11-07, 39 y.o.   MRN: [id number]  HPI  Patient here for flu symptoms that started yesterday.   Fullness in ears x 1 month.  No past medical history on file. Flu test negative but clinically pt is + Review of Systems  Constitutional: Positive for fever and chills.  HENT: Positive for congestion, postnasal drip, rhinorrhea and sore throat. Negative for sinus pressure.   Respiratory: Positive for cough. Negative for chest tightness, shortness of breath and wheezing.   Cardiovascular: Negative for chest pain, palpitations and leg swelling.  Musculoskeletal: Positive for myalgias. Negative for neck stiffness.  Allergic/Immunologic: Negative for environmental allergies.  Psychiatric/Behavioral: Negative for decreased concentration. The patient is not nervous/anxious.        Objective:    Physical Exam  Constitutional: She is oriented to person, place, and time. She appears well-developed and well-nourished. No distress.  HENT:  Right Ear: External ear normal. Tympanic membrane is retracted. Tympanic membrane is not injected. A middle ear effusion is present.  Left Ear: External ear normal. Tympanic membrane is retracted. Tympanic membrane is not injected. A middle ear effusion is present.  Nose: Mucosal edema and rhinorrhea present. No nasal deformity or septal deviation. Right sinus exhibits no maxillary sinus tenderness and no frontal sinus tenderness. Left sinus exhibits no maxillary sinus tenderness and no frontal sinus tenderness.  Mouth/Throat: No oropharyngeal exudate or posterior oropharyngeal edema.  + PND + errythema  Eyes: Conjunctivae are normal. Right eye exhibits no discharge. Left eye exhibits no discharge.  Cardiovascular: Normal rate, regular rhythm and normal heart sounds.   No murmur heard. Pulmonary/Chest: Effort normal and breath sounds normal. No respiratory distress. She has no wheezes. She has no  rales. She exhibits no tenderness.  Musculoskeletal: She exhibits no edema.  Lymphadenopathy:    She has cervical adenopathy.  Neurological: She is alert and oriented to person, place, and time.    BP 110/70 mmHg  Pulse 82  Temp(Src) 98.4 F (36.9 C) (Oral)  Wt 144 lb (65.318 kg)  SpO2 97%  LMP 10/24/2014 Wt Readings from Last 3 Encounters:  11/03/14 144 lb (65.318 kg)  02/05/14 141 lb (63.957 kg)  01/20/12 133 lb 9.6 oz (60.601 kg)     Lab Results  Component Value Date   WBC 4.6 02/05/2014   HGB 12.1 02/05/2014   HCT 36.3 02/05/2014   PLT 169.0 02/05/2014   GLUCOSE 83 02/05/2014   ALT 15 02/05/2014   AST 20 02/05/2014   NA 140 02/05/2014   K 3.3* 02/05/2014   CL 106 02/05/2014   CREATININE 0.6 02/05/2014   BUN 12 02/05/2014   CO2 28 02/05/2014    Dg Chest 2 View  02/05/2014   CLINICAL DATA:  Chest pain  EXAM: CHEST  2 VIEW  COMPARISON:  March 30, 2007  FINDINGS: The heart size and mediastinal contours are within normal limits. There is no focal infiltrate, pulmonary edema, or pleural effusion. The visualized skeletal structures are unremarkable.  IMPRESSION: No active cardiopulmonary disease.   Electronically Signed   By: Sherian ReinWei-Chen  Lin M.D.   On: 02/05/2014 13:43       Assessment & Plan:   Problem List Items Addressed This Visit    None    Visit Diagnoses    Fever, unspecified fever cause    -  Primary    Relevant Orders    POCT Influenza A/B    Influenza with  respiratory manifestations        Relevant Medications    oseltamivir (TAMIFLU) capsule    fluticasone (FLONASE) nasal spray    loratadine (CLARITIN) tablet 10 mg     see AVS   Loreen Freud, DO

## 2014-11-03 NOTE — Patient Instructions (Signed)

## 2014-11-03 NOTE — Progress Notes (Signed)
Pre visit review using our clinic review tool, if applicable. No additional management support is needed unless otherwise documented below in the visit note. 

## 2015-01-22 ENCOUNTER — Other Ambulatory Visit: Payer: Self-pay | Admitting: Obstetrics and Gynecology

## 2015-01-23 LAB — CYTOLOGY - PAP

## 2015-11-13 ENCOUNTER — Encounter (HOSPITAL_BASED_OUTPATIENT_CLINIC_OR_DEPARTMENT_OTHER): Payer: Self-pay | Admitting: *Deleted

## 2015-11-13 ENCOUNTER — Telehealth: Payer: Self-pay

## 2015-11-13 ENCOUNTER — Emergency Department (HOSPITAL_BASED_OUTPATIENT_CLINIC_OR_DEPARTMENT_OTHER)
Admission: EM | Admit: 2015-11-13 | Discharge: 2015-11-13 | Disposition: A | Discharge status: Home / Self Care | Payer: BLUE CROSS/BLUE SHIELD | Attending: Emergency Medicine | Admitting: Emergency Medicine

## 2015-11-13 ENCOUNTER — Telehealth: Payer: Self-pay | Admitting: Family Medicine

## 2015-11-13 DIAGNOSIS — R112 Nausea with vomiting, unspecified: Secondary | ICD-10-CM

## 2015-11-13 DIAGNOSIS — R197 Diarrhea, unspecified: Secondary | ICD-10-CM | POA: Insufficient documentation

## 2015-11-13 DIAGNOSIS — Z79899 Other long term (current) drug therapy: Secondary | ICD-10-CM | POA: Insufficient documentation

## 2015-11-13 DIAGNOSIS — Z3202 Encounter for pregnancy test, result negative: Secondary | ICD-10-CM | POA: Diagnosis not present

## 2015-11-13 DIAGNOSIS — Z7951 Long term (current) use of inhaled steroids: Secondary | ICD-10-CM | POA: Diagnosis not present

## 2015-11-13 DIAGNOSIS — Z791 Long term (current) use of non-steroidal anti-inflammatories (NSAID): Secondary | ICD-10-CM | POA: Insufficient documentation

## 2015-11-13 DIAGNOSIS — R1110 Vomiting, unspecified: Secondary | ICD-10-CM | POA: Diagnosis present

## 2015-11-13 DIAGNOSIS — R1013 Epigastric pain: Secondary | ICD-10-CM | POA: Diagnosis not present

## 2015-11-13 LAB — URINE MICROSCOPIC-ADD ON

## 2015-11-13 LAB — CBC WITH DIFFERENTIAL/PLATELET
Basophils Absolute: 0 10*3/uL (ref 0.0–0.1)
Basophils Relative: 0 %
Eosinophils Absolute: 0 10*3/uL (ref 0.0–0.7)
Eosinophils Relative: 0 %
HEMATOCRIT: 40.5 % (ref 36.0–46.0)
Hemoglobin: 13.5 g/dL (ref 12.0–15.0)
LYMPHS ABS: 0.3 10*3/uL — AB (ref 0.7–4.0)
LYMPHS PCT: 3 %
MCH: 29 pg (ref 26.0–34.0)
MCHC: 33.3 g/dL (ref 30.0–36.0)
MCV: 86.9 fL (ref 78.0–100.0)
MONO ABS: 0.5 10*3/uL (ref 0.1–1.0)
Monocytes Relative: 6 %
NEUTROS ABS: 7.6 10*3/uL (ref 1.7–7.7)
Neutrophils Relative %: 91 %
Platelets: 223 10*3/uL (ref 150–400)
RBC: 4.66 MIL/uL (ref 3.87–5.11)
RDW: 12.9 % (ref 11.5–15.5)
WBC: 8.4 10*3/uL (ref 4.0–10.5)

## 2015-11-13 LAB — COMPREHENSIVE METABOLIC PANEL
ALK PHOS: 46 U/L (ref 38–126)
ALT: 27 U/L (ref 14–54)
AST: 23 U/L (ref 15–41)
Albumin: 4.7 g/dL (ref 3.5–5.0)
Anion gap: 12 (ref 5–15)
BILIRUBIN TOTAL: 0.9 mg/dL (ref 0.3–1.2)
BUN: 17 mg/dL (ref 6–20)
CALCIUM: 9.1 mg/dL (ref 8.9–10.3)
CO2: 21 mmol/L — ABNORMAL LOW (ref 22–32)
CREATININE: 0.72 mg/dL (ref 0.44–1.00)
Chloride: 106 mmol/L (ref 101–111)
GFR calc Af Amer: >60 mL/min (ref >60)
Glucose, Bld: 124 mg/dL — ABNORMAL HIGH (ref 65–99)
Potassium: 3.1 mmol/L — ABNORMAL LOW (ref 3.5–5.1)
Sodium: 139 mmol/L (ref 135–145)
TOTAL PROTEIN: 8 g/dL (ref 6.5–8.1)

## 2015-11-13 LAB — URINALYSIS, ROUTINE W REFLEX MICROSCOPIC
Bilirubin Urine: NEGATIVE
GLUCOSE, UA: NEGATIVE mg/dL
Ketones, ur: 15 mg/dL — AB
Leukocytes, UA: NEGATIVE
Nitrite: NEGATIVE
PH: 6 (ref 5.0–8.0)
Protein, ur: 30 mg/dL — AB
Specific Gravity, Urine: 1.03 (ref 1.005–1.030)

## 2015-11-13 LAB — PREGNANCY, URINE: Preg Test, Ur: NEGATIVE

## 2015-11-13 LAB — LIPASE, BLOOD: Lipase: 24 U/L (ref 11–51)

## 2015-11-13 MED ORDER — GI COCKTAIL ~~LOC~~: 30.0000 mL | Freq: Once | ORAL | Status: DC

## 2015-11-13 MED ORDER — ONDANSETRON HCL 4 MG/2ML IJ SOLN
4.0000 mg | Freq: Once | INTRAMUSCULAR | Status: AC
  Administered 2015-11-13: 4 mg via INTRAVENOUS
  Filled 2015-11-13: qty 2

## 2015-11-13 MED ORDER — ONDANSETRON HCL 4 MG PO TABS: 4.0000 mg | ORAL_TABLET | Freq: Four times a day (QID) | ORAL | Status: DC

## 2015-11-13 MED ORDER — ONDANSETRON 4 MG PO TBDP: 4.0000 mg | ORAL_TABLET | Freq: Once | ORAL | Status: DC

## 2015-11-13 MED ORDER — SODIUM CHLORIDE 0.9 % IV BOLUS (SEPSIS)
1000.0000 mL | Freq: Once | INTRAVENOUS | Status: AC
  Administered 2015-11-13: 1000 mL via INTRAVENOUS

## 2015-11-13 MED ORDER — POTASSIUM CHLORIDE CRYS ER 20 MEQ PO TBCR
20.0000 meq | EXTENDED_RELEASE_TABLET | Freq: Once | ORAL | Status: AC
  Administered 2015-11-13: 20 meq via ORAL
  Filled 2015-11-13: qty 1

## 2015-11-13 MED ORDER — FAMOTIDINE IN NACL 20-0.9 MG/50ML-% IV SOLN
20.0000 mg | Freq: Once | INTRAVENOUS | Status: AC
  Administered 2015-11-13: 20 mg via INTRAVENOUS
  Filled 2015-11-13: qty 50

## 2015-11-13 NOTE — Telephone Encounter (Signed)
She needs ER assessment or at least to go to Urgent care today if we are full.

## 2015-11-13 NOTE — Telephone Encounter (Signed)
Per chart, pt went to the ER as advised.   

## 2015-11-13 NOTE — ED Notes (Signed)
Abdominal pain, vomiting and diarrhea since 1 am. States she is dizzy.

## 2015-11-13 NOTE — ED Provider Notes (Signed)
Physical Exam  BP 96/55 mmHg  Pulse 105  Temp(Src) 98.2 F (36.8 C) (Oral)  Resp 16  Ht 5\' 7"  (1.702 m)  Wt 62.596 kg  BMI 21.61 kg/m2  SpO2 100%  LMP 11/10/2015  Assumed care of patient from [place] [facility] Here with N/V/D. Marked dehydration and hypotension. Mild hypokalemia, likely from vomiting Nausea controlled here in the ED. Tolerating PO Fluids. Will get a second bolus of fluids with plan to discharge if blood pressure improved.   Results for orders placed or performed during the hospital encounter of 11/13/15  Lipase, blood  Result Value Ref Range   Lipase 24 11 - 51 U/L  Comprehensive metabolic panel  Result Value Ref Range   Sodium 139 135 - 145 mmol/L   Potassium 3.1 (L) 3.5 - 5.1 mmol/L   Chloride 106 101 - 111 mmol/L   CO2 21 (L) 22 - 32 mmol/L   Glucose, Bld 124 (H) 65 - 99 mg/dL   BUN 17 6 - 20 mg/dL   Creatinine, Ser 1.610.72 0.44 - 1.00 mg/dL   Calcium 9.1 8.9 - 09.610.3 mg/dL   Total Protein 8.0 6.5 - 8.1 g/dL   Albumin 4.7 3.5 - 5.0 g/dL   AST 23 15 - 41 U/L   ALT 27 14 - 54 U/L   Alkaline Phosphatase 46 38 - 126 U/L   Total Bilirubin 0.9 0.3 - 1.2 mg/dL   GFR calc non Af Amer >60 >60 mL/min   GFR calc Af Amer >60 >60 mL/min   Anion gap 12 5 - 15  Urinalysis, Routine w reflex microscopic ([facility])  Result Value Ref Range   Color, Urine YELLOW YELLOW   APPearance CLEAR CLEAR   Specific Gravity, Urine 1.030 1.005 - 1.030   pH 6.0 5.0 - 8.0   Glucose, UA NEGATIVE NEGATIVE mg/dL   Hgb urine dipstick SMALL (A) NEGATIVE   Bilirubin Urine NEGATIVE NEGATIVE   Ketones, ur 15 (A) NEGATIVE mg/dL   Protein, ur 30 (A) NEGATIVE mg/dL   Nitrite NEGATIVE NEGATIVE   Leukocytes, UA NEGATIVE NEGATIVE  CBC with Differential  Result Value Ref Range   WBC 8.4 4.0 - 10.5 K/uL   RBC 4.66 3.87 - 5.11 MIL/uL   Hemoglobin 13.5 12.0 - 15.0 g/dL   HCT 04.540.5 40.936.0 - 81.146.0 %   MCV 86.9 78.0 - 100.0 fL   MCH 29.0 26.0 - 34.0 pg   MCHC 33.3 30.0 - 36.0 g/dL   RDW 91.412.9 78.211.5 -  95.615.5 %   Platelets 223 150 - 400 K/uL   Neutrophils Relative % 91 %   Neutro Abs 7.6 1.7 - 7.7 K/uL   Lymphocytes Relative 3 %   Lymphs Abs 0.3 (L) 0.7 - 4.0 K/uL   Monocytes Relative 6 %   Monocytes Absolute 0.5 0.1 - 1.0 K/uL   Eosinophils Relative 0 %   Eosinophils Absolute 0.0 0.0 - 0.7 K/uL   Basophils Relative 0 %   Basophils Absolute 0.0 0.0 - 0.1 K/uL  Pregnancy, urine  Result Value Ref Range   Preg Test, Ur NEGATIVE NEGATIVE  Urine microscopic-add on  Result Value Ref Range   Squamous Epithelial / LPF 0-5 (A) NONE SEEN   WBC, UA 0-5 0 - 5 WBC/hpf   RBC / HPF 0-5 0 - 5 RBC/hpf   Bacteria, UA MANY (A) NONE SEEN   Urine-Other MUCOUS PRESENT     Physical Exam Physical Exam  Nursing note and vitals reviewed. Constitutional: She is  oriented to person, place, and time. She appears well-developed and well-nourished. No distress.  HENT:  Head: Normocephalic and atraumatic.  Eyes: Conjunctivae normal and EOM are normal. Pupils are equal, round, and reactive to light. No scleral icterus.  Neck: Normal range of motion.  Cardiovascular: Normal rate, regular rhythm and normal heart sounds.  Exam reveals no gallop and no friction rub.   No murmur heard. Pulmonary/Chest: Effort normal and breath sounds normal. No respiratory distress.  Abdominal: Soft. Bowel sounds are normal. She exhibits no distension and no mass. There is no tenderness. There is no guarding.  Neurological: She is alert and oriented to person, place, and time.  Skin: Skin is warm and dry. She is not diaphoretic.    ED Course  Procedures Filed Vitals:   11/13/15 1420 11/13/15 1500 11/13/15 1557 11/13/15 1725  BP: 92/56 105/64 96/55 111/58  Pulse: 100 88 105 98  Temp:    98.5 F (36.9 C)  TempSrc:    Oral  Resp: Height:      Weight:      SpO2: 100% 100% 100% 100%    Patient is nontoxic, nonseptic appearing, in no apparent distress.  Patient's pain and other symptoms adequately managed in  emergency department.  Fluid bolus given.  Labs, imaging and vitals reviewed.  Patient does not meet the SIRS or Sepsis criteria.  On repeat exam patient does not have a surgical abdomin and there are no peritoneal signs.  No indication of appendicitis, bowel obstruction, bowel perforation, cholecystitis, diverticulitis, PID or ectopic pregnancy.  Patient discharged home with symptomatic treatment and given strict instructions for follow-up with their primary care physician.  I have also discussed reasons to return immediately to the ER.  Patient expresses understanding and agrees with plan.         Arthor Captain, PA-C 11/20/15 1634  Linwood Dibbles, MD 11/21/15 [phone]

## 2015-11-13 NOTE — Telephone Encounter (Signed)
Received call from [facility] about patient refusing to go to ER. Called patient who states she has been vomiting since 1am this morning. Has explosive diarrhea,light headed,with lethargy and stomach pain. I will call patient back. Advised there were no openings here and that Dr. Laury AxonLowne is not in today.

## 2015-11-13 NOTE — Telephone Encounter (Signed)
Patient Name: Beverly RanaSVETLANA Banks  DOB: 05/14/1976    Initial Comment Caller states wife has vomiting and diarrhea   Nurse Assessment  Nurse: Stefano GaulStringer, RN, Dwana CurdVera Date/Time (Eastern Time): 11/13/2015 1:00:53 PM  Confirm and document reason for call. If symptomatic, describe symptoms. You must click the next button to save text entered. ---Caller states she has vomiting and diarrhea. Started around 1 am this am. She has vomited x 6 and having explosive diarrhea. She is dizzy when she stands up. She has urinated.  Has the patient traveled out of the country within the last 30 days? ---No  Does the patient have any new or worsening symptoms? ---Yes  Will a triage be completed? ---Yes  Related visit to physician within the last 2 weeks? ---No  Does the PT have any chronic conditions? (i.e. diabetes, asthma, etc.) ---No  Is the patient pregnant or possibly pregnant? (Ask all females between the ages of 3712-55) ---No  Is this a behavioral health or substance abuse call? ---No     Guidelines    Guideline Title Affirmed Question Affirmed Notes  Vomiting [1] SEVERE vomiting (e.g., 6 or more times/day) AND [2] present > 8 hours    Final Disposition User   Go to ED Now (or PCP triage) Stefano GaulStringer, RN, Vera    Comments  pt states she is not sure if she will go to the ER. she will have to discuss it with her spouse. Called office and spoke to Clydie BraunKaren and gave report that pt has had diarrhea and vomiting since 1 am and has vomited x 6 and is getting dizzy when she get up with ER outcome and that someone from the office will call her back.   Referrals  GO TO FACILITY REFUSED   Disagree/Comply: Comply

## 2015-11-13 NOTE — ED Notes (Signed)
Pt states she has a very loose yellow stool in the BR. Assisted back to stretcher.

## 2015-11-13 NOTE — Discharge Instructions (Signed)
Diarrhea Follow up with her primary care physician. Return for inability to tolerate fluids. Take Zofran as needed for nausea. Stay well-hydrated. Eat a clear liquid diet for the next 36-48 hours. Diarrhea is watery poop (stool). It can make you feel weak, tired, thirsty, or give you a dry mouth (signs of dehydration). Watery poop is a sign of another problem, most often an infection. It often lasts 2-3 days. It can last longer if it is a sign of something serious. Take care of yourself as told by your doctor. HOME CARE   Drink 1 cup (8 ounces) of fluid each time you have watery poop.  Do not drink the following fluids:  Those that contain simple sugars (fructose, glucose, galactose, lactose, sucrose, maltose).  Sports drinks.  Fruit juices.  Whole milk products.  Sodas.  Drinks with caffeine (coffee, tea, soda) or alcohol.  Oral rehydration solution may be used if the doctor says it is okay. You may make your own solution. Follow this recipe:   - teaspoon table salt.   teaspoon baking soda.   teaspoon salt substitute containing potassium chloride.  1 tablespoons sugar.  1 liter (34 ounces) of water.  Avoid the following foods:  High fiber foods, such as raw fruits and vegetables.  Nuts, seeds, and whole grain breads and cereals.   Those that are sweetened with sugar alcohols (xylitol, sorbitol, mannitol).  Try eating the following foods:  Starchy foods, such as rice, toast, pasta, low-sugar cereal, oatmeal, baked potatoes, crackers, and bagels.  Bananas.  Applesauce.  Eat probiotic-rich foods, such as yogurt and milk products that are fermented.  Wash your hands well after each time you have watery poop.  Only take medicine as told by your doctor.  Take a warm bath to help lessen burning or pain from having watery poop. GET HELP RIGHT AWAY IF:   You cannot drink fluids without throwing up (vomiting).  You keep throwing up.  You have blood in your poop,  or your poop looks black and tarry.  You do not pee (urinate) in 6-8 hours, or there is only a small amount of very dark pee.  You have belly (abdominal) pain that gets worse or stays in the same spot (localizes).  You are weak, dizzy, confused, or light-headed.  You have a very bad headache.  Your watery poop gets worse or does not get better.  You have a fever or lasting symptoms for more than 2-3 days.  You have a fever and your symptoms suddenly get worse. MAKE SURE YOU:   Understand these instructions.  Will watch your condition.  Will get help right away if you are not doing well or get worse.   This information is not intended to replace advice given to you by your health care provider. Make sure you discuss any questions you have with your health care provider.   Document Released: 02/08/2008 Document Revised: 09/12/2014 Document Reviewed: 04/29/2012 Elsevier Interactive Patient Education [employer]

## 2015-11-13 NOTE — Telephone Encounter (Signed)
Patient is in the ED.     KP 

## 2015-11-13 NOTE — Telephone Encounter (Signed)
Husband [facility](Matt) called stating that patient has been vomiting with diarrhea for over 12 hours. No Fever. Transferred to [facility]. Spoke with Amy

## 2015-11-13 NOTE — ED Provider Notes (Signed)
CSN: [id number]     Arrival date & time 11/13/15  1344 History   First MD Initiated Contact with Patient 11/13/15 1504     Chief Complaint  Patient presents with  . Emesis     (Consider location/radiation/quality/duration/timing/severity/associated sxs/prior Treatment) Patient is a 40 y.o. female presenting with vomiting. The history is provided by the patient. No language interpreter was used.  Emesis   Beverly Banks is a 40 y.o Female with no significant past medical history who presents with multiple episodes of vomiting and diarrhea and epigastric abdominal pain since yesterday afternoon. Her last episode of vomiting was approximately 3 hours ago. Denies any treatment prior to arrival. They called the telemetry doctor who told them to come to the ED for possible Zofran. She denies being pregnant. She is not on birth control. She is on her third day of her menstrual cycle at this time. She denies any sick contacts. She has had 2 C-sections in the past. She has not been able to tolerate fluids since yesterday. She ate veggie spring rolls and a piece of cake prior to getting sick.  She denies any fever, constipation, dysuria, hematuria, urinary frequency.     History reviewed. No pertinent past medical history. Past Surgical History  Procedure Laterality Date  . Tonsillectomy    . Cesarean section     No family history on file. Social History  Substance Use Topics  . Smoking status: Never Smoker   . Smokeless tobacco: Never Used  . Alcohol Use: No   OB History    No data available     Review of Systems  Gastrointestinal: Positive for vomiting.  All other systems reviewed and are negative.     Allergies  Review of patient's allergies indicates no known allergies.  Home Medications   Prior to Admission medications   Medication Sig Start Date End Date Taking? Authorizing Provider  cyclobenzaprine (FLEXERIL) 10 MG tablet Take 1 tablet (10 mg total) by mouth 3 (three) times  daily as needed for muscle spasms. 02/05/14   Lelon PerlaYvonne R Lowne, DO  FINACEA 15 % cream  01/03/12   Historical Provider, MD  fluticasone (FLONASE) 50 MCG/ACT nasal spray Place 2 sprays into both nostrils daily. 11/03/14   Lelon PerlaYvonne R Lowne, DO  loratadine (CLARITIN) 10 MG tablet Take 1 tablet (10 mg total) by mouth daily. 11/03/14   Lelon PerlaYvonne R Lowne, DO  naproxen (NAPROSYN) 500 MG tablet Take 1 tablet (500 mg total) by mouth 2 (two) times daily with a meal. 02/05/14   Grayling CongressYvonne R Lowne, DO  ondansetron (ZOFRAN) 4 MG tablet Take 1 tablet (4 mg total) by mouth every 6 (six) hours. 11/13/15   Embry Huss Patel-Mills, PA-C  oseltamivir (TAMIFLU) 75 MG capsule Take 1 capsule (75 mg total) by mouth 2 (two) times daily. 11/03/14   Lelon PerlaYvonne R Lowne, DO  valACYclovir (VALTREX) 1000 MG tablet Take 1,000 mg by mouth 2 (two) times daily.    Historical Provider, MD   BP 96/55 mmHg  Pulse 105  Temp(Src) 98.2 F (36.8 C) (Oral)  Resp 16  Ht 5\' 7"  (1.702 m)  Wt 62.596 kg  BMI 21.61 kg/m2  SpO2 100%  LMP 11/10/2015 Physical Exam  Constitutional: She is oriented to person, place, and time. She appears well-developed and well-nourished.  HENT:  Head: Normocephalic and atraumatic.  Eyes: Conjunctivae are normal.  Neck: Normal range of motion. Neck supple.  Cardiovascular: Normal rate, regular rhythm and normal heart sounds.   Pulmonary/Chest: Effort normal and  breath sounds normal. No respiratory distress. She has no wheezes. She has no rales.  Regular rate and rhythm. No murmur.  Lungs: Clear to auscultation bilaterally.  Abdominal: Soft. Normal appearance. She exhibits no distension. There is tenderness in the epigastric area. There is no rebound and no guarding.  Epigastric abdominal pain. No guarding or rebound. Normal-appearing abdomen. No distention.  Musculoskeletal: Normal range of motion.  Neurological: She is alert and oriented to person, place, and time.  Skin: Skin is warm and dry.  Nursing note and vitals  reviewed.   ED Course  Procedures (including critical care time) Labs Review Labs Reviewed  COMPREHENSIVE METABOLIC PANEL - Abnormal; Notable for the following:    Potassium 3.1 (*)    CO2 21 (*)    Glucose, Bld 124 (*)    All other components within normal limits  URINALYSIS, ROUTINE W REFLEX MICROSCOPIC (NOT AT [facility]) - Abnormal; Notable for the following:    Hgb urine dipstick SMALL (*)    Ketones, ur 15 (*)    Protein, ur 30 (*)    All other components within normal limits  CBC WITH DIFFERENTIAL/PLATELET - Abnormal; Notable for the following:    Lymphs Abs 0.3 (*)    All other components within normal limits  URINE MICROSCOPIC-ADD ON - Abnormal; Notable for the following:    Squamous Epithelial / LPF 0-5 (*)    Bacteria, UA MANY (*)    All other components within normal limits  LIPASE, BLOOD  PREGNANCY, URINE    Imaging Review No results found. I have personally reviewed and evaluated these lab results as part of my medical decision-making.   EKG Interpretation None      MDM   Final diagnoses:  Nausea vomiting and diarrhea   Patient presents for nausea, vomiting, and diarrhea since yesterday afternoon. She has mild epigastric tenderness on exam. She appears weak. She is hypokalemic which is most likely due to vomiting and diarrhea. Otherwise, her labs are not concerning. I do not believe this is a surgical abdomen. She has no urinary complaints. I believe the patient is dehydrated which is causing her weakness. She was given a liter of fluids. She was also given Zofran and Pepcid.   Recheck: Patient is tolerating sips of water. She is slightly hypotensive. Will give second liter of fluids.  I discussed this patient with Arthor Captain, PA-C. Patient will go home with Zofran. I discussed clear liquid diet for the next 36-48 hours. Return precautions were discussed as well as follow-up.     Catha Gosselin, PA-C 11/13/15 1620  Linwood Dibbles, MD 11/13/15 [phone]

## 2015-11-13 NOTE — ED Notes (Addendum)
Pt placed on auto vitals Q30. [facility]

## 2015-11-13 NOTE — Telephone Encounter (Signed)
Called patient who gave me symptoms she is having. Advised Dr. Laury AxonLowne is not in today and there are no open visits per sheduling. Forwarded information to [employer] Martin.

## 2016-06-02 DIAGNOSIS — Z01419 Encounter for gynecological examination (general) (routine) without abnormal findings: Secondary | ICD-10-CM | POA: Diagnosis not present

## 2016-06-02 DIAGNOSIS — Z6821 Body mass index (BMI) 21.0-21.9, adult: Secondary | ICD-10-CM | POA: Diagnosis not present

## 2016-07-04 ENCOUNTER — Encounter: Payer: Self-pay | Admitting: Family Medicine

## 2016-07-04 ENCOUNTER — Ambulatory Visit (INDEPENDENT_AMBULATORY_CARE_PROVIDER_SITE_OTHER): Payer: BLUE CROSS/BLUE SHIELD | Admitting: Family Medicine

## 2016-07-04 VITALS — BP 110/72 | HR 68 | Temp 98.0°F | Resp 16 | Ht 66.0 in | Wt 140.2 lb

## 2016-07-04 DIAGNOSIS — G43009 Migraine without aura, not intractable, without status migrainosus: Secondary | ICD-10-CM | POA: Diagnosis not present

## 2016-07-04 DIAGNOSIS — M62838 Other muscle spasm: Secondary | ICD-10-CM | POA: Diagnosis not present

## 2016-07-04 DIAGNOSIS — R42 Dizziness and giddiness: Secondary | ICD-10-CM | POA: Diagnosis not present

## 2016-07-04 LAB — COMPREHENSIVE METABOLIC PANEL
ALT: 15 U/L (ref 0–35)
AST: 16 U/L (ref 0–37)
Albumin: 4.7 g/dL (ref 3.5–5.2)
Alkaline Phosphatase: 35 U/L — ABNORMAL LOW (ref 39–117)
BUN: 12 mg/dL (ref 6–23)
CO2: 28 meq/L (ref 19–32)
Calcium: 9.8 mg/dL (ref 8.4–10.5)
Chloride: 106 mEq/L (ref 96–112)
Creatinine, Ser: 0.68 mg/dL (ref 0.40–1.20)
GFR: 102.9 mL/min (ref >60.00)
GLUCOSE: 82 mg/dL (ref 70–99)
POTASSIUM: 3.9 meq/L (ref 3.5–5.1)
SODIUM: 142 meq/L (ref 135–145)
Total Bilirubin: 0.6 mg/dL (ref 0.2–1.2)
Total Protein: 7.4 g/dL (ref 6.0–8.3)

## 2016-07-04 LAB — TSH: TSH: 0.64 u[IU]/mL (ref 0.35–4.50)

## 2016-07-04 LAB — CBC WITH DIFFERENTIAL/PLATELET
BASOS PCT: 0.5 % (ref 0.0–3.0)
Basophils Absolute: 0 10*3/uL (ref 0.0–0.1)
EOS PCT: 0.4 % (ref 0.0–5.0)
Eosinophils Absolute: 0 10*3/uL (ref 0.0–0.7)
HCT: 37.3 % (ref 36.0–46.0)
HEMOGLOBIN: 12.6 g/dL (ref 12.0–15.0)
LYMPHS ABS: 1.3 10*3/uL (ref 0.7–4.0)
Lymphocytes Relative: 21.9 % (ref 12.0–46.0)
MCHC: 33.9 g/dL (ref 30.0–36.0)
MCV: 87.4 fl (ref 78.0–100.0)
MONO ABS: 0.3 10*3/uL (ref 0.1–1.0)
Monocytes Relative: 5.8 % (ref 3.0–12.0)
NEUTROS PCT: 71.4 % (ref 43.0–77.0)
Neutro Abs: 4.1 10*3/uL (ref 1.4–7.7)
Platelets: 226 10*3/uL (ref 150.0–400.0)
RBC: 4.26 Mil/uL (ref 3.87–5.11)
RDW: 13.8 % (ref 11.5–15.5)
WBC: 5.8 10*3/uL (ref 4.0–10.5)

## 2016-07-04 LAB — POCT URINALYSIS DIPSTICK
BILIRUBIN UA: NEGATIVE
Blood, UA: NEGATIVE
Glucose, UA: NEGATIVE
KETONES UA: NEGATIVE
Leukocytes, UA: NEGATIVE
Nitrite, UA: NEGATIVE
Protein, UA: NEGATIVE
Spec Grav, UA: 1.01
Urobilinogen, UA: 0.2
pH, UA: 7

## 2016-07-04 LAB — SEDIMENTATION RATE: SED RATE: 2 mm/h (ref 0–20)

## 2016-07-04 LAB — VITAMIN B12: VITAMIN B 12: 405 pg/mL (ref 211–911)

## 2016-07-04 LAB — POCT URINE PREGNANCY: Preg Test, Ur: NEGATIVE

## 2016-07-04 MED ORDER — CYCLOBENZAPRINE HCL 10 MG PO TABS: 10.0000 mg | ORAL_TABLET | Freq: Three times a day (TID) | ORAL | 0 refills | Status: DC | PRN

## 2016-07-04 NOTE — Progress Notes (Signed)
Pre visit review using our clinic review tool, if applicable. No additional management support is needed unless otherwise documented below in the visit note. 

## 2016-07-04 NOTE — Patient Instructions (Signed)

## 2016-07-04 NOTE — Progress Notes (Signed)
Patient ID: Beverly Banks, female    DOB: 03-02-1976  Age: 40 y.o. MRN: [id number]    Subjective:  Subjective  HPI Beverly Banks presents for dizziness that started yesterday and N no this am as well.  Dizziness is better.  She is still lightheaded  Pt states she did feel better after eating.  bp seemed higher at home on home monitor.   She also c/o tightness in head.  Headache is gone but she still feels lightheadedness and tightness in head.  No hx head trauma.  + vision changes.  She states the vision in her right eye seems worse-- but it comes and goes.  She saw the optometrist about 1 year ago.   She has had 5 migraines in last 4 years--- Ibuprofen usually takes care of it -- yesterday she had to take two but it did subside.      Review of Systems  Constitutional: Negative for appetite change, diaphoresis, fatigue and unexpected weight change.  Eyes: Positive for photophobia and visual disturbance.  Respiratory: Negative for cough, chest tightness, shortness of breath and wheezing.   Cardiovascular: Negative for chest pain, palpitations and leg swelling.  Gastrointestinal: Positive for nausea and vomiting.  Endocrine: Negative for cold intolerance, heat intolerance, polydipsia, polyphagia and polyuria.  Genitourinary: Negative for difficulty urinating, dysuria and frequency.  Neurological: Positive for dizziness and light-headedness. Negative for numbness and headaches.    Histo No past medical history on file.  She has a past surgical history that includes Tonsillectomy and Cesarean section.   Her family history is not on file.She reports that she has never smoked. She has never used smokeless tobacco. She reports that she does not drink alcohol or use drugs.  Current Outpatient Prescriptions on File Prior to Visit  Medication Sig Dispense Refill  . FINACEA 15 % cream     . valACYclovir (VALTREX) 1000 MG tablet Take 1,000 mg by mouth 2 (two) times daily.    . fluticasone  (FLONASE) 50 MCG/ACT nasal spray Place 2 sprays into both nostrils daily. (Patient not taking: Reported on 07/04/2016) 16 g 6  . loratadine (CLARITIN) 10 MG tablet Take 1 tablet (10 mg total) by mouth daily. (Patient not taking: Reported on 07/04/2016) 30 tablet 11  . ondansetron (ZOFRAN) 4 MG tablet Take 1 tablet (4 mg total) by mouth every 6 (six) hours. (Patient not taking: Reported on 07/04/2016) 12 tablet 0   No current facility-administered medications on file prior to visit.      Objective:  Objective  Physical Exam  Constitutional: She is oriented to person, place, and time. She appears well-developed and well-nourished.  HENT:  Head: Normocephalic and atraumatic.  Eyes: Conjunctivae and EOM are normal.  Neck: Normal range of motion. Neck supple. No JVD present. Carotid bruit is not present. No thyromegaly present.  Cardiovascular: Normal rate, regular rhythm and normal heart sounds.   No murmur heard. Pulmonary/Chest: Effort normal and breath sounds normal. No respiratory distress. She has no wheezes. She has no rales. She exhibits no tenderness.  Abdominal: Soft. Bowel sounds are normal. She exhibits no distension and no mass. There is no tenderness. There is no rebound and no guarding.  Musculoskeletal: Normal range of motion. She exhibits tenderness. She exhibits no edema.       Arms: Neurological: She is alert and oriented to person, place, and time. She has normal reflexes. She displays normal reflexes. No cranial nerve deficit. She exhibits normal muscle tone. Coordination normal.  Psychiatric:  She has a normal mood and affect. Her behavior is normal. Judgment and thought content normal.  Nursing note and vitals reviewed.  BP 110/72 (BP Location: Left Arm, Patient Position: Sitting, Cuff Size: Normal)   Pulse 68   Temp 98 F (36.7 C) (Oral)   Resp 16   Ht '5\' 6"'  (1.676 m)   Wt 140 lb 3.2 oz (63.6 kg)   LMP 07/02/2016 (Exact Date)   SpO2 98%   BMI 22.63 kg/m  Wt  Readings from Last 3 Encounters:  07/04/16 140 lb 3.2 oz (63.6 kg)  11/13/15 138 lb (62.6 kg)  11/03/14 144 lb (65.3 kg)     Lab Results  Component Value Date   WBC 5.8 07/04/2016   HGB 12.6 07/04/2016   HCT 37.3 07/04/2016   PLT 226.0 07/04/2016   GLUCOSE 82 07/04/2016   ALT 15 07/04/2016   AST 16 07/04/2016   NA 142 07/04/2016   K 3.9 07/04/2016   CL 106 07/04/2016   CREATININE 0.68 07/04/2016   BUN 12 07/04/2016   CO2 28 07/04/2016   TSH 0.64 07/04/2016    No results found.   Assessment & Plan:  Plan  I have discontinued Ms. Hattabaugh's naproxen, cyclobenzaprine, and oseltamivir. I am also having her start on cyclobenzaprine. Additionally, I am having her maintain her FINACEA, valACYclovir, fluticasone, loratadine, and ondansetron.  Meds ordered this encounter  Medications  . cyclobenzaprine (FLEXERIL) 10 MG tablet    Sig: Take 1 tablet (10 mg total) by mouth 3 (three) times daily as needed for muscle spasms.    Dispense:  30 tablet    Refill:  0    Problem List Items Addressed This Visit    None    Visit Diagnoses    Migraine without aura and without status migrainosus, not intractable    -  Primary   Relevant Medications   cyclobenzaprine (FLEXERIL) 10 MG tablet   Other Relevant Orders   Comprehensive metabolic panel (Completed)   CBC with Differential/Platelet (Completed)   POCT urinalysis dipstick (Completed)   POCT urine pregnancy (Completed)   TSH (Completed)   Vitamin B12 (Completed)   Vitamin D 1,25 dihydroxy   Sed Rate (ESR) (Completed)   MR Brain Wo Contrast   Dizziness and giddiness       Relevant Orders   EKG 12-Lead (Completed)   Comprehensive metabolic panel (Completed)   CBC with Differential/Platelet (Completed)   POCT urinalysis dipstick (Completed)   POCT urine pregnancy (Completed)   TSH (Completed)   Vitamin B12 (Completed)   Vitamin D 1,25 dihydroxy   Sed Rate (ESR) (Completed)   MR Brain Wo Contrast   Muscle spasm        Relevant Medications   cyclobenzaprine (FLEXERIL) 10 MG tablet    preg test neg Since migraine is different than usual will get MRI brain Flexeril given for muscle spasm-- prn Check labs and pt will call or rto if symptoms change at all  Follow-up: No Follow-up on file.  Ann Held, DO

## 2016-07-07 LAB — VITAMIN D 1,25 DIHYDROXY
VITAMIN D 1, 25 (OH) TOTAL: 43 pg/mL (ref 18–72)
VITAMIN D3 1, 25 (OH): 43 pg/mL
Vitamin D2 1, 25 (OH)2: <8 pg/mL

## 2016-07-08 ENCOUNTER — Telehealth: Payer: Self-pay

## 2016-07-08 NOTE — Telephone Encounter (Signed)
Pt would like to wait on MRI for now.  [facility] and made them aware.

## 2016-07-08 NOTE — Telephone Encounter (Signed)
-----   Message from Donato SchultzYvonne R Lowne Chase, DO sent at 07/04/2016  5:11 PM EDT ----- Regarding: FW: MRI   Pt is claustrophobic and would like something for the MRI---- Xanax 0.5 mg 1 po 30 mg prior  to procedure and she should bring the bottle with her incase she needs more during procedure ----- Message ----- From: Ria Clockestiny A Best Sent: 07/04/2016   3:59 PM To: Donato SchultzYvonne R Lowne Chase, DO Subject: MRI                                            Hello Myrene BuddyYvonne,  The patient above did advise that she may be claustrophobic and would like to have some medication prior to getting her MRI done. If you could please prescribe for patient prior to her appointment which is Saturday Nov. 2nd.   Thank you [facility]

## 2016-07-08 NOTE — Telephone Encounter (Signed)
It can be put off if pt feels like she is improving

## 2016-07-08 NOTE — Telephone Encounter (Signed)
Called patient.  She says she does not think she needs anything at this time.  She did want to know if Dr. Laury AxonLowne still recommends a MRI based on labs and current symptoms.  She says she has not experienced any dizziness since Monday, just feels a little fuzzy.    Please advise.

## 2016-07-09 ENCOUNTER — Ambulatory Visit (HOSPITAL_BASED_OUTPATIENT_CLINIC_OR_DEPARTMENT_OTHER): Payer: BLUE CROSS/BLUE SHIELD

## 2016-08-10 ENCOUNTER — Encounter: Payer: Self-pay | Admitting: Family Medicine

## 2016-08-10 DIAGNOSIS — D225 Melanocytic nevi of trunk: Secondary | ICD-10-CM | POA: Diagnosis not present

## 2016-08-10 DIAGNOSIS — D2261 Melanocytic nevi of right upper limb, including shoulder: Secondary | ICD-10-CM | POA: Diagnosis not present

## 2016-08-10 DIAGNOSIS — D2262 Melanocytic nevi of left upper limb, including shoulder: Secondary | ICD-10-CM | POA: Diagnosis not present

## 2016-08-10 DIAGNOSIS — L821 Other seborrheic keratosis: Secondary | ICD-10-CM | POA: Diagnosis not present

## 2016-08-24 DIAGNOSIS — J019 Acute sinusitis, unspecified: Secondary | ICD-10-CM | POA: Diagnosis not present

## 2016-09-28 ENCOUNTER — Other Ambulatory Visit: Payer: Self-pay | Admitting: Obstetrics and Gynecology

## 2016-09-28 DIAGNOSIS — N6489 Other specified disorders of breast: Secondary | ICD-10-CM

## 2016-09-28 DIAGNOSIS — N6012 Diffuse cystic mastopathy of left breast: Secondary | ICD-10-CM | POA: Diagnosis not present

## 2016-10-04 ENCOUNTER — Ambulatory Visit
Admission: RE | Admit: 2016-10-04 | Discharge: 2016-10-04 | Disposition: A | Discharge status: Home / Self Care | Payer: BLUE CROSS/BLUE SHIELD | Source: Ambulatory Visit | Attending: Obstetrics and Gynecology | Admitting: Obstetrics and Gynecology

## 2016-10-04 DIAGNOSIS — N6489 Other specified disorders of breast: Secondary | ICD-10-CM

## 2016-10-04 DIAGNOSIS — R928 Other abnormal and inconclusive findings on diagnostic imaging of breast: Secondary | ICD-10-CM | POA: Diagnosis not present

## 2016-10-15 DIAGNOSIS — J101 Influenza due to other identified influenza virus with other respiratory manifestations: Secondary | ICD-10-CM | POA: Diagnosis not present

## 2017-04-04 DIAGNOSIS — J0191 Acute recurrent sinusitis, unspecified: Secondary | ICD-10-CM | POA: Diagnosis not present

## 2017-05-09 ENCOUNTER — Encounter: Payer: Self-pay | Admitting: Internal Medicine

## 2017-05-09 ENCOUNTER — Ambulatory Visit (INDEPENDENT_AMBULATORY_CARE_PROVIDER_SITE_OTHER): Payer: BLUE CROSS/BLUE SHIELD | Admitting: Internal Medicine

## 2017-05-09 VITALS — BP 118/68 | HR 95 | Temp 97.5°F | Resp 14 | Ht 66.0 in | Wt 135.0 lb

## 2017-05-09 DIAGNOSIS — J029 Acute pharyngitis, unspecified: Secondary | ICD-10-CM

## 2017-05-09 DIAGNOSIS — J069 Acute upper respiratory infection, unspecified: Secondary | ICD-10-CM

## 2017-05-09 LAB — POCT RAPID STREP A (OFFICE): RAPID STREP A SCREEN: NEGATIVE

## 2017-05-09 NOTE — Progress Notes (Signed)
Pre visit review using our clinic review tool, if applicable. No additional management support is needed unless otherwise documented below in the visit note. 

## 2017-05-09 NOTE — Progress Notes (Signed)
   Subjective:    Patient ID: Beverly Banks, female    DOB: 1975/11/15, 41 y.o.   MRN: [id number]  DOS:  05/09/2017 Type of visit - description : Acute Interval history: Symptoms started 3 days ago with a mild headache, sinus congestion, postnasal dripping and malaise. Yesterday he started with cough, mild sore throat. Tried Zyrtec and Sudafed without much success.    Review of Systems No fever chills No nausea, vomiting, diarrhea No myalgias or rash No sick contacts.   No past medical history on file.  Past Surgical History:  Procedure Laterality Date  . CESAREAN SECTION    . TONSILLECTOMY      Social History   Social History  . Marital status: Married    Spouse name: N/A  . Number of children: N/A  . Years of education: N/A   Occupational History  . Not on file.   Social History Main Topics  . Smoking status: Never Smoker  . Smokeless tobacco: Never Used  . Alcohol use No  . Drug use: No  . Sexual activity: Not on file   Other Topics Concern  . Not on file   Social History Narrative  . No narrative on file      Allergies as of 05/09/2017   No Known Allergies     Medication List       Accurate as of 05/09/17  9:02 AM. Always use your most recent med list.          cyclobenzaprine 10 MG tablet Commonly known as:  FLEXERIL Take 1 tablet (10 mg total) by mouth 3 (three) times daily as needed for muscle spasms.   FINACEA 15 % cream Generic drug:  Azelaic Acid   fluticasone 50 MCG/ACT nasal spray Commonly known as:  FLONASE Place 2 sprays into both nostrils daily.   loratadine 10 MG tablet Commonly known as:  CLARITIN Take 1 tablet (10 mg total) by mouth daily.   ondansetron 4 MG tablet Commonly known as:  ZOFRAN Take 1 tablet (4 mg total) by mouth every 6 (six) hours.   valACYclovir 1000 MG tablet Commonly known as:  VALTREX Take 1,000 mg by mouth 2 (two) times daily.          Objective:   Physical Exam BP 118/68 (BP Location:  Left Arm, Patient Position: Sitting, Cuff Size: Small)   Pulse 95   Temp (!) 97.5 F (36.4 C) (Oral)   Resp 14   Ht 5\' 6"  (1.676 m)   Wt 135 lb (61.2 kg)   SpO2 96%   BMI 21.79 kg/m  General:   Well developed, well nourished . NAD.  HEENT:  Normocephalic . Face symmetric, atraumatic. TMs normal. Nose is slightly congested, sinuses: Mildly TTP on the right. Throat: Symmetric, no white patches, minimal redness. Lungs:  CTA B Normal respiratory effort, no intercostal retractions, no accessory muscle use. Heart: RRR,  no murmur.  No pretibial edema bilaterally  Skin: Not pale. Not jaundice Neurologic:  alert & oriented X3.  Speech normal, gait appropriate for age and unassisted Psych--  Cognition and judgment appear intact.  Cooperative with normal attention span and concentration.  Behavior appropriate. No anxious or depressed appearing.      Assessment & Plan:   41 year old female, not on BCP (condoms), LMP 1 week ago. Viral syndrome, URI: Strep test negative, recommend conservative treatment, C instructions.

## 2017-05-09 NOTE — Patient Instructions (Signed)
Rest, fluids , tylenol  For cough:  Take Mucinex DM twice a day as needed until better  For nasal congestion: Use OTC Nasocort or Flonase : 2 nasal sprays on each side of the nose in the morning until you feel better   Get pseudoephedrine 30 mg (behind the counter, you need to talk with the pharmacist) take one tablet 3 or 4 times a day as needed for congestion   ok Zyrtec OTC   Call if not gradually better over the next  10 days  Call anytime if the symptoms are severe

## 2017-05-12 ENCOUNTER — Ambulatory Visit (INDEPENDENT_AMBULATORY_CARE_PROVIDER_SITE_OTHER): Payer: BLUE CROSS/BLUE SHIELD | Admitting: Family Medicine

## 2017-05-12 ENCOUNTER — Encounter: Payer: Self-pay | Admitting: Family Medicine

## 2017-05-12 VITALS — BP 124/80 | HR 75 | Temp 98.8°F | Ht 68.0 in | Wt 133.2 lb

## 2017-05-12 DIAGNOSIS — J069 Acute upper respiratory infection, unspecified: Secondary | ICD-10-CM

## 2017-05-12 DIAGNOSIS — B9789 Other viral agents as the cause of diseases classified elsewhere: Secondary | ICD-10-CM

## 2017-05-12 MED ORDER — BENZONATATE 100 MG PO CAPS: 100.0000 mg | ORAL_CAPSULE | Freq: Three times a day (TID) | ORAL | 0 refills | Status: DC | PRN

## 2017-05-12 NOTE — Patient Instructions (Addendum)
Continue to push fluids, practice good hand hygiene, and cover your mouth if you cough.  If you start having fevers, shaking or shortness of breath, seek immediate care.  Send me a MyChart message on Tuesday evening if you are not getting any better.

## 2017-05-12 NOTE — Progress Notes (Signed)
Chief Complaint  Patient presents with  . Cough    congestion    Beverly Banks here for URI complaints.  Duration: 7 days  Associated symptoms: sinus congestion, rhinorrhea, HA, cough, and malaise Denies: itchy watery eyes, ST,ear pain, ear drainage, wheezing, shortness of breath, myalgia and fevers/rigors  Treatment to date: Sudafed, Zyrtec, INCS, Mucinex, Benadryl Sick contacts: No  ROS:  Const: Denies fevers HEENT: As noted in HPI Lungs: No SOB  Past Medical History:  Diagnosis Date  . No known health problems    BP 124/80 (BP Location: Left Arm, Patient Position: Sitting, Cuff Size: Normal)   Pulse 75   Temp 98.8 F (37.1 C) (Oral)   Ht 5\' 8"  (1.727 m)   Wt 133 lb 4 oz (60.4 kg)   SpO2 99%   BMI 20.26 kg/m  General: Awake, alert, appears stated age HEENT: AT, [place], ears patent b/l and TM's retracted b/l, neg otherwise, nares patent w/o discharge, pharynx pink and without exudates, MMM Neck: No masses or asymmetry Heart: RRR Lungs: CTAB, no accessory muscle use Psych: Age appropriate judgment and insight, normal mood and affect  Viral URI with cough - Plan: benzonatate (TESSALON) 100 MG capsule  Cont current therapy.  Send MyChart message by next Tuesday if no improvement.  Continue to push fluids, practice good hand hygiene, cover mouth when coughing. F/u prn. If starting to experience fevers, shaking, or shortness of breath, seek immediate care. Pt voiced understanding and agreement to the plan.  [name] [place], DO 05/12/17 11:48 AM

## 2017-05-12 NOTE — Progress Notes (Signed)
Pre visit review using our clinic review tool, if applicable. No additional management support is needed unless otherwise documented below in the visit note. 

## 2017-05-16 DIAGNOSIS — H11442 Conjunctival cysts, left eye: Secondary | ICD-10-CM | POA: Diagnosis not present

## 2017-05-23 ENCOUNTER — Encounter (HOSPITAL_BASED_OUTPATIENT_CLINIC_OR_DEPARTMENT_OTHER): Payer: Self-pay

## 2017-05-23 ENCOUNTER — Telehealth: Payer: Self-pay | Admitting: Family Medicine

## 2017-05-23 ENCOUNTER — Emergency Department (HOSPITAL_BASED_OUTPATIENT_CLINIC_OR_DEPARTMENT_OTHER)
Admission: EM | Admit: 2017-05-23 | Discharge: 2017-05-23 | Disposition: A | Discharge status: Home / Self Care | Payer: BLUE CROSS/BLUE SHIELD | Attending: Emergency Medicine | Admitting: Emergency Medicine

## 2017-05-23 DIAGNOSIS — S0003XA Contusion of scalp, initial encounter: Secondary | ICD-10-CM

## 2017-05-23 DIAGNOSIS — Y9239 Other specified sports and athletic area as the place of occurrence of the external cause: Secondary | ICD-10-CM | POA: Diagnosis not present

## 2017-05-23 DIAGNOSIS — Y9375 Activity, martial arts: Secondary | ICD-10-CM | POA: Diagnosis not present

## 2017-05-23 DIAGNOSIS — R42 Dizziness and giddiness: Secondary | ICD-10-CM | POA: Diagnosis not present

## 2017-05-23 DIAGNOSIS — W2189XA Striking against or struck by other sports equipment, initial encounter: Secondary | ICD-10-CM | POA: Insufficient documentation

## 2017-05-23 DIAGNOSIS — S0990XA Unspecified injury of head, initial encounter: Secondary | ICD-10-CM

## 2017-05-23 DIAGNOSIS — Y999 Unspecified external cause status: Secondary | ICD-10-CM | POA: Diagnosis not present

## 2017-05-23 MED ORDER — MECLIZINE HCL 25 MG PO TABS: 25.0000 mg | ORAL_TABLET | Freq: Three times a day (TID) | ORAL | 0 refills | Status: DC | PRN

## 2017-05-23 NOTE — ED Provider Notes (Signed)
[facility] Provider Note   CSN: [id number] Arrival date & time: 05/23/17  1808     History   Chief Complaint Chief Complaint  Patient presents with  . Head Injury    HPI Beverly Banks is a 41 y.o. female.  HPI 49-year-old female in no pertinent past history presents the emergency department with vertiginous symptoms that began after a minor head injury 3 days ago. Patient reports that she had a metal ring fall onto her head at a [employer]. She denied any severe pain at that time, loss of consciousness, amnesia. She did not have any visual disturbances, focal deficits, nausea or vomiting. Reported that she began having vertiginous symptoms the following morning which were exacerbated with movement of her head. This resulted in her having severe nausea and vomiting that was nonbloody and nonbilious. Symptoms have improved since onset, and she is currently asymptomatic regarding her vertiginous symptoms. She is reporting some mild scalp tenderness and soreness, exacerbated with palpation. No other alleviating or aggravating factors. She denies any other physical complaints. Past Medical History:  Diagnosis Date  . No known health problems     There are no active problems to display for this patient.   Past Surgical History:  Procedure Laterality Date  . CESAREAN SECTION    . TONSILLECTOMY      OB History    No data available       Home Medications    Prior to Admission medications   Medication Sig Start Date End Date Taking? Authorizing Provider  meclizine (ANTIVERT) 25 MG tablet Take 1 tablet (25 mg total) by mouth 3 (three) times daily as needed for dizziness. 05/23/17   Nira Conn, MD    Family History No family history on file.  Social History Social History  Substance Use Topics  . Smoking status: Never Smoker  . Smokeless tobacco: Never Used  . Alcohol use Yes     Comment: occ     Allergies   Patient has no known  allergies.   Review of Systems Review of Systems All other systems are reviewed and are negative for acute change except as noted in the HPI   Physical Exam Updated Vital Signs BP (!) 163/64 (BP Location: Left Arm)   Pulse 94   Temp 98.9 F (37.2 C) (Oral)   Resp 18   Ht  (1.702 m)   Wt 61.8 kg (136 lb 3.9 oz)   LMP 05/22/2017   SpO2 100%   BMI 21.34 kg/m   Physical Exam  Constitutional: She is oriented to person, place, and time. She appears well-developed and well-nourished. No distress.  HENT:  Head: Normocephalic and atraumatic.  Right Ear: Tympanic membrane and external ear normal.  Left Ear: Tympanic membrane and external ear normal.  Nose: Nose normal.  Eyes: Conjunctivae and EOM are normal. No scleral icterus.  Neck: Normal range of motion and phonation normal.  Cardiovascular: Normal rate and regular rhythm.   Pulmonary/Chest: Effort normal. No stridor. No respiratory distress.  Abdominal: She exhibits no distension.  Musculoskeletal: Normal range of motion. She exhibits no edema.  Neurological: She is alert and oriented to person, place, and time.  Mental Status: Alert and oriented to person, place, and time. Attention and concentration normal. Speech clear. Recent memory is intact  Cranial Nerves  II Visual Fields: Intact to confrontation. Visual fields intact. III, IV, VI: Pupils equal and reactive to light and near. Full eye movement without nystagmus  V Facial Sensation: Normal. No weakness of masticatory muscles  VII: No facial weakness or asymmetry  VIII Auditory Acuity: Grossly normal  IX/X: The uvula is midline; the palate elevates symmetrically  XI: Normal sternocleidomastoid and trapezius strength  XII: The tongue is midline. No atrophy or fasciculations.   Motor System: Muscle Strength: 5/5 and symmetric in the upper and lower extremities. No pronation or drift.  Muscle Tone: Tone and muscle bulk are normal in the upper and lower extremities.    Reflexes: DTRs: 1+ and symmetrical in all four extremities. Plantar responses are flexor bilaterally.  Coordination: Intact finger-to-nose, heel-to-shin. No tremor.  Sensation: Intact to light touch, and pinprick.  Gait: Routine gait normal.   HINTS: Nystagmus: Lateral nystagmus bilaterally but no vertical nystagmus. Head impulse. Abnormal head impulse with rightward movement Skew: Normal   Skin: She is not diaphoretic.  Psychiatric: She has a normal mood and affect. Her behavior is normal.  Vitals reviewed.    ED Treatments / Results  Labs (all labs ordered are listed, but only abnormal results are displayed) Labs Reviewed - No data to display  EKG  EKG Interpretation None       Radiology No results found.  Procedures Procedures (including critical care time)  Medications Ordered in ED Medications - No data to display   Initial Impression / Assessment and Plan / ED Course  I have reviewed the triage vital signs and the nursing notes.  Pertinent labs & imaging results that were available during my care of the patient were reviewed by me and considered in my medical decision making (see chart for details).     Vertiginous symptoms appear to be peripheral in nature given the reassuring hints exam. No focal deficits on exam. Headache is secondary to head trauma and is localized to the region consistent with local contusion. No indication for imaging at this time.  We'll prescribe Antivert.  The patient is safe for discharge with strict return precautions.   Final Clinical Impressions(s) / ED Diagnoses   Final diagnoses:  Injury of head, initial encounter  Contusion of scalp, initial encounter  Vertigo   Disposition: Discharge  Condition: Good  I have discussed the results, Dx and Tx plan with the patient who expressed understanding and agree(s) with the plan. Discharge instructions discussed at great length. The patient was given strict return  precautions who verbalized understanding of the instructions. No further questions at time of discharge.    New Prescriptions   MECLIZINE (ANTIVERT) 25 MG TABLET    Take 1 tablet (25 mg total) by mouth 3 (three) times daily as needed for dizziness.    Follow Up: Donato Schultz, DO [address] [place] [address] [phone]  Schedule an appointment as soon as possible for a visit  As needed, If symptoms do not improve or  worsen      Cardama, Amadeo Garnet, MD 05/23/17 2039
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
## 2017-10-25 DIAGNOSIS — D2262 Melanocytic nevi of left upper limb, including shoulder: Secondary | ICD-10-CM | POA: Diagnosis not present

## 2017-10-25 DIAGNOSIS — L814 Other melanin hyperpigmentation: Secondary | ICD-10-CM | POA: Diagnosis not present

## 2017-10-25 DIAGNOSIS — D2271 Melanocytic nevi of right lower limb, including hip: Secondary | ICD-10-CM | POA: Diagnosis not present

## 2017-10-25 DIAGNOSIS — D2272 Melanocytic nevi of left lower limb, including hip: Secondary | ICD-10-CM | POA: Diagnosis not present

## 2017-12-10 DIAGNOSIS — J209 Acute bronchitis, unspecified: Secondary | ICD-10-CM | POA: Diagnosis not present

## 2017-12-10 DIAGNOSIS — J019 Acute sinusitis, unspecified: Secondary | ICD-10-CM | POA: Diagnosis not present

## 2018-01-09 DIAGNOSIS — J069 Acute upper respiratory infection, unspecified: Secondary | ICD-10-CM | POA: Diagnosis not present

## 2018-01-19 DIAGNOSIS — Z6822 Body mass index (BMI) 22.0-22.9, adult: Secondary | ICD-10-CM | POA: Diagnosis not present

## 2018-01-19 DIAGNOSIS — Z01419 Encounter for gynecological examination (general) (routine) without abnormal findings: Secondary | ICD-10-CM | POA: Diagnosis not present

## 2018-01-28 DIAGNOSIS — J0100 Acute maxillary sinusitis, unspecified: Secondary | ICD-10-CM | POA: Diagnosis not present

## 2018-01-28 DIAGNOSIS — J40 Bronchitis, not specified as acute or chronic: Secondary | ICD-10-CM | POA: Diagnosis not present

## 2018-08-10 DIAGNOSIS — Z23 Encounter for immunization: Secondary | ICD-10-CM | POA: Diagnosis not present

## 2018-08-30 DIAGNOSIS — R21 Rash and other nonspecific skin eruption: Secondary | ICD-10-CM | POA: Diagnosis not present

## 2018-10-30 DIAGNOSIS — D485 Neoplasm of uncertain behavior of skin: Secondary | ICD-10-CM | POA: Diagnosis not present

## 2018-10-30 DIAGNOSIS — D1801 Hemangioma of skin and subcutaneous tissue: Secondary | ICD-10-CM | POA: Diagnosis not present

## 2018-10-30 DIAGNOSIS — D2261 Melanocytic nevi of right upper limb, including shoulder: Secondary | ICD-10-CM | POA: Diagnosis not present

## 2018-10-30 DIAGNOSIS — D225 Melanocytic nevi of trunk: Secondary | ICD-10-CM | POA: Diagnosis not present

## 2018-10-30 DIAGNOSIS — D2262 Melanocytic nevi of left upper limb, including shoulder: Secondary | ICD-10-CM | POA: Diagnosis not present

## 2018-11-06 ENCOUNTER — Telehealth: Payer: Self-pay | Admitting: *Deleted

## 2018-11-06 NOTE — Telephone Encounter (Signed)
Received Dermatopathology Report results from [facility]; forwarded to provider/SLS 03/03     

## 2019-07-24 DIAGNOSIS — Z1231 Encounter for screening mammogram for malignant neoplasm of breast: Secondary | ICD-10-CM | POA: Diagnosis not present

## 2019-07-24 DIAGNOSIS — Z6822 Body mass index (BMI) 22.0-22.9, adult: Secondary | ICD-10-CM | POA: Diagnosis not present

## 2019-07-24 DIAGNOSIS — Z01419 Encounter for gynecological examination (general) (routine) without abnormal findings: Secondary | ICD-10-CM | POA: Diagnosis not present

## 2019-07-29 ENCOUNTER — Other Ambulatory Visit: Payer: Self-pay | Admitting: Obstetrics and Gynecology

## 2019-07-29 DIAGNOSIS — R928 Other abnormal and inconclusive findings on diagnostic imaging of breast: Secondary | ICD-10-CM

## 2019-07-29 DIAGNOSIS — Z130 Encounter for screening for diseases of the blood and blood-forming organs and certain disorders involving the immune mechanism: Secondary | ICD-10-CM | POA: Diagnosis not present

## 2019-07-29 DIAGNOSIS — Z1329 Encounter for screening for other suspected endocrine disorder: Secondary | ICD-10-CM | POA: Diagnosis not present

## 2019-07-29 DIAGNOSIS — Z131 Encounter for screening for diabetes mellitus: Secondary | ICD-10-CM | POA: Diagnosis not present

## 2019-07-29 DIAGNOSIS — Z13228 Encounter for screening for other metabolic disorders: Secondary | ICD-10-CM | POA: Diagnosis not present

## 2019-07-29 DIAGNOSIS — Z13220 Encounter for screening for lipoid disorders: Secondary | ICD-10-CM | POA: Diagnosis not present

## 2019-07-30 ENCOUNTER — Other Ambulatory Visit: Payer: Self-pay

## 2019-07-30 ENCOUNTER — Ambulatory Visit
Admission: RE | Admit: 2019-07-30 | Discharge: 2019-07-30 | Disposition: A | Discharge status: Home / Self Care | Payer: BC Managed Care – PPO | Source: Ambulatory Visit | Attending: Obstetrics and Gynecology | Admitting: Obstetrics and Gynecology

## 2019-07-30 ENCOUNTER — Ambulatory Visit: Admission: RE | Admit: 2019-07-30 | Payer: BLUE CROSS/BLUE SHIELD | Source: Ambulatory Visit

## 2019-07-30 DIAGNOSIS — R928 Other abnormal and inconclusive findings on diagnostic imaging of breast: Secondary | ICD-10-CM | POA: Diagnosis not present

## 2019-11-21 DIAGNOSIS — D225 Melanocytic nevi of trunk: Secondary | ICD-10-CM | POA: Diagnosis not present

## 2019-11-21 DIAGNOSIS — D2262 Melanocytic nevi of left upper limb, including shoulder: Secondary | ICD-10-CM | POA: Diagnosis not present

## 2019-11-21 DIAGNOSIS — D2272 Melanocytic nevi of left lower limb, including hip: Secondary | ICD-10-CM | POA: Diagnosis not present

## 2019-11-21 DIAGNOSIS — D2261 Melanocytic nevi of right upper limb, including shoulder: Secondary | ICD-10-CM | POA: Diagnosis not present

## 2020-01-14 DIAGNOSIS — M26602 Left temporomandibular joint disorder, unspecified: Secondary | ICD-10-CM | POA: Diagnosis not present

## 2020-06-10 DIAGNOSIS — Z20822 Contact with and (suspected) exposure to covid-19: Secondary | ICD-10-CM | POA: Diagnosis not present

## 2020-06-11 DIAGNOSIS — Z20822 Contact with and (suspected) exposure to covid-19: Secondary | ICD-10-CM | POA: Diagnosis not present

## 2020-06-11 DIAGNOSIS — Z03818 Encounter for observation for suspected exposure to other biological agents ruled out: Secondary | ICD-10-CM | POA: Diagnosis not present

## 2020-08-07 DIAGNOSIS — Z03818 Encounter for observation for suspected exposure to other biological agents ruled out: Secondary | ICD-10-CM | POA: Diagnosis not present

## 2020-09-22 DIAGNOSIS — Z20822 Contact with and (suspected) exposure to covid-19: Secondary | ICD-10-CM | POA: Diagnosis not present

## 2020-11-24 DIAGNOSIS — Z1231 Encounter for screening mammogram for malignant neoplasm of breast: Secondary | ICD-10-CM | POA: Diagnosis not present

## 2020-11-24 DIAGNOSIS — Z6822 Body mass index (BMI) 22.0-22.9, adult: Secondary | ICD-10-CM | POA: Diagnosis not present

## 2020-11-24 DIAGNOSIS — Z01419 Encounter for gynecological examination (general) (routine) without abnormal findings: Secondary | ICD-10-CM | POA: Diagnosis not present

## 2020-11-25 DIAGNOSIS — D2262 Melanocytic nevi of left upper limb, including shoulder: Secondary | ICD-10-CM | POA: Diagnosis not present

## 2020-11-25 DIAGNOSIS — D2261 Melanocytic nevi of right upper limb, including shoulder: Secondary | ICD-10-CM | POA: Diagnosis not present

## 2020-11-25 DIAGNOSIS — D2271 Melanocytic nevi of right lower limb, including hip: Secondary | ICD-10-CM | POA: Diagnosis not present

## 2020-11-25 DIAGNOSIS — D1801 Hemangioma of skin and subcutaneous tissue: Secondary | ICD-10-CM | POA: Diagnosis not present

## 2020-12-23 DIAGNOSIS — Z13220 Encounter for screening for lipoid disorders: Secondary | ICD-10-CM | POA: Diagnosis not present

## 2020-12-23 DIAGNOSIS — Z13228 Encounter for screening for other metabolic disorders: Secondary | ICD-10-CM | POA: Diagnosis not present

## 2020-12-23 DIAGNOSIS — Z130 Encounter for screening for diseases of the blood and blood-forming organs and certain disorders involving the immune mechanism: Secondary | ICD-10-CM | POA: Diagnosis not present

## 2020-12-23 DIAGNOSIS — Z131 Encounter for screening for diabetes mellitus: Secondary | ICD-10-CM | POA: Diagnosis not present

## 2020-12-23 DIAGNOSIS — Z1329 Encounter for screening for other suspected endocrine disorder: Secondary | ICD-10-CM | POA: Diagnosis not present

## 2021-01-23 DIAGNOSIS — J029 Acute pharyngitis, unspecified: Secondary | ICD-10-CM | POA: Diagnosis not present

## 2021-01-23 DIAGNOSIS — R059 Cough, unspecified: Secondary | ICD-10-CM | POA: Diagnosis not present

## 2021-01-23 DIAGNOSIS — R5383 Other fatigue: Secondary | ICD-10-CM | POA: Diagnosis not present

## 2021-01-23 DIAGNOSIS — R509 Fever, unspecified: Secondary | ICD-10-CM | POA: Diagnosis not present

## 2021-06-15 DIAGNOSIS — Z7689 Persons encountering health services in other specified circumstances: Secondary | ICD-10-CM | POA: Diagnosis not present

## 2021-06-15 DIAGNOSIS — K219 Gastro-esophageal reflux disease without esophagitis: Secondary | ICD-10-CM | POA: Diagnosis not present

## 2021-06-15 DIAGNOSIS — F411 Generalized anxiety disorder: Secondary | ICD-10-CM | POA: Diagnosis not present

## 2021-06-15 DIAGNOSIS — Z87898 Personal history of other specified conditions: Secondary | ICD-10-CM | POA: Diagnosis not present

## 2021-06-30 DIAGNOSIS — K219 Gastro-esophageal reflux disease without esophagitis: Secondary | ICD-10-CM | POA: Diagnosis not present

## 2021-07-08 DIAGNOSIS — F411 Generalized anxiety disorder: Secondary | ICD-10-CM | POA: Diagnosis not present

## 2021-07-08 DIAGNOSIS — K219 Gastro-esophageal reflux disease without esophagitis: Secondary | ICD-10-CM | POA: Diagnosis not present

## 2021-07-08 DIAGNOSIS — R0789 Other chest pain: Secondary | ICD-10-CM | POA: Diagnosis not present

## 2021-07-14 DIAGNOSIS — R9431 Abnormal electrocardiogram [ECG] [EKG]: Secondary | ICD-10-CM | POA: Diagnosis not present

## 2021-07-14 DIAGNOSIS — G4489 Other headache syndrome: Secondary | ICD-10-CM | POA: Diagnosis not present

## 2021-07-14 DIAGNOSIS — R079 Chest pain, unspecified: Secondary | ICD-10-CM | POA: Diagnosis not present

## 2021-07-14 DIAGNOSIS — R0789 Other chest pain: Secondary | ICD-10-CM | POA: Diagnosis not present

## 2021-07-19 ENCOUNTER — Other Ambulatory Visit: Payer: Self-pay | Admitting: Physician Assistant

## 2021-07-19 ENCOUNTER — Emergency Department (HOSPITAL_COMMUNITY)
Admission: EM | Admit: 2021-07-19 | Discharge: 2021-07-19 | Disposition: A | Discharge status: Home / Self Care | Payer: BC Managed Care – PPO | Attending: Emergency Medicine | Admitting: Emergency Medicine

## 2021-07-19 ENCOUNTER — Emergency Department (HOSPITAL_COMMUNITY): Payer: BC Managed Care – PPO

## 2021-07-19 ENCOUNTER — Emergency Department (HOSPITAL_BASED_OUTPATIENT_CLINIC_OR_DEPARTMENT_OTHER): Payer: BC Managed Care – PPO

## 2021-07-19 ENCOUNTER — Encounter (HOSPITAL_COMMUNITY): Payer: Self-pay | Admitting: Physician Assistant

## 2021-07-19 DIAGNOSIS — I471 Supraventricular tachycardia: Secondary | ICD-10-CM

## 2021-07-19 DIAGNOSIS — Z20822 Contact with and (suspected) exposure to covid-19: Secondary | ICD-10-CM | POA: Diagnosis not present

## 2021-07-19 DIAGNOSIS — R079 Chest pain, unspecified: Secondary | ICD-10-CM

## 2021-07-19 DIAGNOSIS — I10 Essential (primary) hypertension: Secondary | ICD-10-CM | POA: Insufficient documentation

## 2021-07-19 DIAGNOSIS — R000 Tachycardia, unspecified: Secondary | ICD-10-CM | POA: Diagnosis not present

## 2021-07-19 DIAGNOSIS — R519 Headache, unspecified: Secondary | ICD-10-CM | POA: Diagnosis not present

## 2021-07-19 DIAGNOSIS — I4719 Other supraventricular tachycardia: Secondary | ICD-10-CM

## 2021-07-19 LAB — URINALYSIS, MICROSCOPIC (REFLEX): RBC / HPF: NONE SEEN RBC/hpf (ref 0–5)

## 2021-07-19 LAB — COMPREHENSIVE METABOLIC PANEL
ALT: 18 U/L (ref 0–44)
AST: 18 U/L (ref 15–41)
Albumin: 4.8 g/dL (ref 3.5–5.0)
Alkaline Phosphatase: 37 U/L — ABNORMAL LOW (ref 38–126)
Anion gap: 10 (ref 5–15)
BUN: 12 mg/dL (ref 6–20)
CO2: 24 mmol/L (ref 22–32)
Calcium: 10 mg/dL (ref 8.9–10.3)
Chloride: 105 mmol/L (ref 98–111)
Creatinine, Ser: 0.69 mg/dL (ref 0.44–1.00)
GFR, Estimated: >60 mL/min (ref >60)
Glucose, Bld: 101 mg/dL — ABNORMAL HIGH (ref 70–99)
Potassium: 4 mmol/L (ref 3.5–5.1)
Sodium: 139 mmol/L (ref 135–145)
Total Bilirubin: 1.1 mg/dL (ref 0.3–1.2)
Total Protein: 7.8 g/dL (ref 6.5–8.1)

## 2021-07-19 LAB — URINALYSIS, ROUTINE W REFLEX MICROSCOPIC
Bilirubin Urine: NEGATIVE
Glucose, UA: NEGATIVE mg/dL
Ketones, ur: 15 mg/dL — AB
Leukocytes,Ua: NEGATIVE
Nitrite: NEGATIVE
Protein, ur: NEGATIVE mg/dL
Specific Gravity, Urine: <1.005 — ABNORMAL LOW (ref 1.005–1.030)
pH: 5.5 (ref 5.0–8.0)

## 2021-07-19 LAB — TROPONIN I (HIGH SENSITIVITY)
Troponin I (High Sensitivity): 7 ng/L (ref <18)
Troponin I (High Sensitivity): 7 ng/L (ref <18)

## 2021-07-19 LAB — D-DIMER, QUANTITATIVE: D-Dimer, Quant: <0.27 ug/mL-FEU (ref 0.00–0.50)

## 2021-07-19 LAB — LIPASE, BLOOD: Lipase: 27 U/L (ref 11–51)

## 2021-07-19 LAB — CBC
HCT: 41.2 % (ref 36.0–46.0)
Hemoglobin: 13.4 g/dL (ref 12.0–15.0)
MCH: 29.5 pg (ref 26.0–34.0)
MCHC: 32.5 g/dL (ref 30.0–36.0)
MCV: 90.5 fL (ref 80.0–100.0)
Platelets: 258 10*3/uL (ref 150–400)
RBC: 4.55 MIL/uL (ref 3.87–5.11)
RDW: 12.8 % (ref 11.5–15.5)
WBC: 7.8 10*3/uL (ref 4.0–10.5)
nRBC: 0 % (ref 0.0–0.2)

## 2021-07-19 LAB — ECHOCARDIOGRAM COMPLETE
AR max vel: 2.84 cm2
AV Area VTI: 2.47 cm2
AV Area mean vel: 2.6 cm2
AV Mean grad: 4 mmHg
AV Peak grad: 7.3 mmHg
Ao pk vel: 1.35 m/s
Area-P 1/2: 3.39 cm2
S' Lateral: 2.9 cm

## 2021-07-19 LAB — RESP PANEL BY RT-PCR (FLU A&B, COVID) ARPGX2
Influenza A by PCR: NEGATIVE
Influenza B by PCR: NEGATIVE
SARS Coronavirus 2 by RT PCR: NEGATIVE

## 2021-07-19 LAB — TSH: TSH: 1.554 u[IU]/mL (ref 0.350–4.500)

## 2021-07-19 LAB — I-STAT BETA HCG BLOOD, ED (MC, WL, AP ONLY): I-stat hCG, quantitative: <5 m[IU]/mL (ref <5)

## 2021-07-19 MED ORDER — METOPROLOL TARTRATE 25 MG PO TABS
25.0000 mg | ORAL_TABLET | Freq: Once | ORAL | Status: AC
  Administered 2021-07-19: 25 mg via ORAL
  Filled 2021-07-19: qty 1

## 2021-07-19 MED ORDER — METOPROLOL TARTRATE 25 MG PO TABS: ORAL_TABLET | ORAL | 1 refills | Status: DC

## 2021-07-19 NOTE — ED Notes (Signed)
ED Provider at bedside. 

## 2021-07-19 NOTE — Discharge Instructions (Signed)
Dear Beverly Banks [name],  Thank you for allowing [place] to take care of you today.  We hope you begin feeling better soon.  - Please follow-up with your primary care physician or schedule an appointment to establish a primary care doctor if you do not have one already. - Please return to the Emergency Department or call 911 for chest pain, shortness of breath, severe pain, altered mental status, or if you have any reason to think you may need emergency medical care. -Follow-up with cardiology as scheduled.  You will receive a phone call with your outpatient follow-up appointment.  Follow-up with your PCP as needed. -Remember to stay well-hydrated -Start metoprolol as prescribed   Sincerely,  Dwaine Gale, DO Department of Emergency Medicine [place]   Tachycardia

## 2021-07-19 NOTE — ED Provider Notes (Signed)
Reported [facility] EMERGENCY DEPARTMENT Provider Note   CSN: [id number] Arrival date & time: 07/19/21  0930     History Chief Complaint  Patient presents with   Chest Pain   Headache   Hypertension    Beverly Banks is a 45 y.o. female with no significant past medical history presents with 5 days of chest tightness, chest burning along with pressure around the temples, throbbing in the neck, and elevated blood pressures when she checks them at home.  Patient reports that she has been dealing with this problem intermittently for a few weeks, and went to see her NP who was suspicious of acid reflux versus anxiety.  Patient did receive some testing with GI including breath gas, which did not show evidence of H. pylori.  Patient was begun on omeprazole, sertraline last Wednesday, reports that problems worsened after beginning taking his medications.  Patient does report that the last 2 days she took a quarter of her mom's captopril which improved her blood pressure, and symptoms. No chest pain at time of evaluation. Patient reports chest tightness / burning. No radiation. Does endorse feeling sweaty occasionally recently associated with the episodes of high blood pressure. Patient denies any facial droop, weakness, confusion, nausea, vomiting.  Patient does endorse minimal appetite loss over the last week.  Patient denies any vision changes, dizziness, lightheadedness, feeling of heart racing.  Patient reports "headaches" is coming on gradually, squeezing around her temples, without true pain, no lateralization, no photophobia, no phonophobia.   Chest Pain Associated symptoms: headache   Headache Hypertension Associated symptoms include chest pain and headaches.      Past Medical History:  Diagnosis Date   No known health problems     There are no problems to display for this patient.   Past Surgical History:  Procedure Laterality Date   CESAREAN SECTION      TONSILLECTOMY       OB History   No obstetric history on file.     No family history on file.  Social History   Tobacco Use   Smoking status: Never   Smokeless tobacco: Never  Substance Use Topics   Alcohol use: Yes    Comment: occ   Drug use: No    Home Medications Prior to Admission medications   Medication Sig Start Date End Date Taking? Authorizing Provider  FINACEA 15 % FOAM Apply 1 g topically daily. 07/07/21  Yes [provider]  ibuprofen (ADVIL) 200 MG tablet Take 200 mg by mouth every 6 (six) hours as needed for headache or mild pain.   Yes [provider]  VALERIAN PO Take 1 capsule by mouth at bedtime as needed (To relax/sleep.).   Yes [provider]  meclizine (ANTIVERT) 25 MG tablet Take 1 tablet (25 mg total) by mouth 3 (three) times daily as needed for dizziness. Patient not taking: Reported on 07/19/2021 05/23/17   Nira Conn, MD    Allergies    Patient has no known allergies.  Review of Systems   Review of Systems  Cardiovascular:  Positive for chest pain.  Neurological:  Positive for headaches.  All other systems reviewed and are negative.  Physical Exam Updated Vital Signs BP 137/64   Pulse 99   Temp 98.8 F (37.1 C) (Oral)   Resp 14   LMP 07/07/2021   SpO2 100%   Physical Exam Vitals and nursing note reviewed.  Constitutional:      General: She is not  in acute distress.    Appearance: Normal appearance.  HENT:     Head: Normocephalic and atraumatic.  Eyes:     General:        Right eye: No discharge.        Left eye: No discharge.  Cardiovascular:     Rate and Rhythm: Normal rate and regular rhythm.     Heart sounds: No murmur heard.   No friction rub. No gallop.  Pulmonary:     Effort: Pulmonary effort is normal.     Breath sounds: Normal breath sounds.  Abdominal:     General: Bowel sounds are normal.     Palpations: Abdomen is soft.     Comments: Diffuse tenderness to palpation in  the upper abdomen without rebound, rigidity, guarding.  Negative Murphy sign.  No evidence of ecchymosis, or other lesions on the abdominal wall.  Negative McBurney's point, Rovsing tenderness.  Skin:    General: Skin is warm and dry.     Capillary Refill: Capillary refill takes less than 2 seconds.  Neurological:     Mental Status: She is alert and oriented to person, place, and time.     Comments: CN3-12 grossly intact. Intact strength 5/5 bil UE/LE. Romberg negative, gait normal.  Psychiatric:        Mood and Affect: Mood normal.        Behavior: Behavior normal.    ED Results / Procedures / Treatments   Labs (all labs ordered are listed, but only abnormal results are displayed) Labs Reviewed  COMPREHENSIVE METABOLIC PANEL - Abnormal; Notable for the following components:      Result Value   Glucose, Bld 101 (*)    Alkaline Phosphatase 37 (*)    All other components within normal limits  URINALYSIS, ROUTINE W REFLEX MICROSCOPIC - Abnormal; Notable for the following components:   Specific Gravity, Urine <1.005 (*)    Hgb urine dipstick TRACE (*)    Ketones, ur 15 (*)    All other components within normal limits  URINALYSIS, MICROSCOPIC (REFLEX) - Abnormal; Notable for the following components:   Bacteria, UA RARE (*)    All other components within normal limits  RESP PANEL BY RT-PCR (FLU A&B, COVID) ARPGX2  CBC  LIPASE, BLOOD  TSH  D-DIMER, QUANTITATIVE  I-STAT BETA HCG BLOOD, ED ([facility], [facility], [facility] ONLY)  TROPONIN I (HIGH SENSITIVITY)  TROPONIN I (HIGH SENSITIVITY)    EKG EKG Interpretation  Date/Time:  Monday July 19 2021 10:18:31 EST Ventricular Rate:  111 PR Interval:  180 QRS Duration: 110 QT Interval:  342 QTC Calculation: 465 R Axis:   93 Text Interpretation: Sinus tachycardia Right atrial enlargement Rightward axis Incomplete right bundle branch block Nonspecific ST abnormality Abnormal ECG Confirmed by Carmin Muskrat [phone]) on 07/19/2021 12:47:09  PM  Radiology DG Chest 2 View  Result Date: 07/19/2021 CLINICAL DATA:  Chest pain. EXAM: CHEST - 2 VIEW COMPARISON:  February 05, 2014. FINDINGS: The heart size and mediastinal contours are within normal limits. Both lungs are clear. The visualized skeletal structures are unremarkable. IMPRESSION: No active cardiopulmonary disease. Electronically Signed   By: Marijo Conception M.D.   On: 07/19/2021 10:36    Procedures Procedures   Medications Ordered in ED Medications - No data to display  ED Course  I have reviewed the triage vital signs and the nursing notes.  Pertinent labs & imaging results that were available during my care of the patient were reviewed by me and considered  in my medical decision making (see chart for details).    MDM Rules/Calculators/A&P                         Given the large differential diagnosis for Trine Mardi Mainland, the decision making in this case is of high complexity.  After evaluating all of the data points in this case, the presentation of Kennedie Enis Boehning is NOT consistent with Acute Coronary Syndrome (ACS) and/or myocardial ischemia, pulmonary embolism, aortic dissection; Borhaave's, significant arrythmia, pneumothorax, cardiac tamponade, or other emergent cardiopulmonary condition.  Further, the presentation of Adisyn Toshiba Heit is NOT consistent with pericarditis, myocarditis, cholecystitis, pancreatitis, mediastinitis, endocarditis, new valvular disease.  Additionally, the presentation of Janalynn Earnest Bailey NOT consistent with flail chest, cardiac contusion, ARDS, or significant intra-thoracic or intra-abdominal bleeding.  Moreover, this presentation is NOT consistent with pneumonia, sepsis, or pyelonephritis.  In addition to normal ACS workup, will obtain d-dimer at this time to assess for possible PE, as well as repeat EKG to assess EKG changes without tachycardia. Patient has EKG from 3 days prior which has concern for st depression  in 3, 4, 5, meeting threshold only in 4.   Patient has one episode of tachycardia up to 150 with return of elevated blood pressure, chest pain, headache symptoms. Concern for abnormal conduction. Plan to consult cardiology EP. Encouraged continuation of her PPI.  3:09 PM Care of Astella Mardi Mainland transferred to Dr. Haskell Riling and Dr. Reather Converse at the end of my shift as the patient will require reassessment once labs/imaging have resulted. Patient presentation, ED course, and plan of care discussed with review of all pertinent labs and imaging. Please see his/her note for further details regarding further ED course and disposition. Plan at time of handoff is pending Cardiology EP consult. This may be altered or completely changed at the discretion of the oncoming team pending results of further workup.   Final Clinical Impression(s) / ED Diagnoses Final diagnoses:  Tachycardia    Rx / DC Orders ED Discharge Orders     None        Dorien Chihuahua 07/19/21 1510    Carmin Muskrat, MD 07/20/21 [phone]

## 2021-07-19 NOTE — Consult Note (Addendum)
Cardiology Consultation:   Patient ID: Youstina Supplee MRN: [id number]; DOB: 1976/02/06  Admit date: 07/19/2021 Date of Consult: 07/19/2021  PCP:  No primary care provider on file.   [facility] Providers Cardiologist:  New   Patient Profile:   Beverly Banks is a 45 y.o. female with no significant PMH who is being seen 07/19/2021 for the evaluation of tachycardia at the request of Dr. Jeraldine Loots.  History of Present Illness:   Beverly Banks has no significant past medical history.  She had a sepsis after she was born and was dealing with chronic pneumonia up until age of 54.  Intermittent dizziness upon standing at young age.  No family history of premature cardiac death or MI.  Beverly Banks had few episode of very mild substernal chest tightness with questionable palpitation about a month or so ago.  Seen by PCP and recommended cessation of caffeine intake.  Also placed on omeprazole and sertraline.  Then she had a trip to [place].  She is an Airline pilot.  However for past 4 days she has intermittent episode of burning squeezing chest pressure radiating to her head.  Feels like pressure at the temporal area.  Unable to tell if heart rate was high or not.  Some diaphoresis but no radiation to arm. Her blood pressure was elevated (SBP 160) and took her mother's captopril with improved blood pressure.  This has progressively worsened.  This morning she again had similar but worse episode leading to ER evaluation.  Patient had sudden onset tachycardia at 1:35 PM while in ER.  Heart rate jumped to 150 from 100s and then slowly came down.  She did felt palpitation at that time.  Sent denies dizziness, orthopnea, PND, syncope, lower extremity edema or melena. Denies illicit drug use or tobacco smoking.  Social drinking.   TSH and D-dimer are normal Respiratory panel negative for influenza and COVID High-sensitivity troponin negative  Past Medical History:  Diagnosis Date   No  known health problems     Past Surgical History:  Procedure Laterality Date   CESAREAN SECTION     TONSILLECTOMY       Inpatient Medications: Scheduled Meds:  Continuous Infusions:  PRN Meds:   Allergies:   No Known Allergies  Social History:   Social History   Socioeconomic History   Marital status: Married    Spouse name: Not on file   Number of children: Not on file   Years of education: Not on file   Highest education level: Not on file  Occupational History   Not on file  Tobacco Use   Smoking status: Never   Smokeless tobacco: Never  Substance and Sexual Activity   Alcohol use: Yes    Comment: occ   Drug use: No   Sexual activity: Not on file  Other Topics Concern   Not on file  Social History Narrative   Not on file   Social Determinants of Health   Financial Resource Strain: Not on file  Food Insecurity: Not on file  Transportation Needs: Not on file  Physical Activity: Not on file  Stress: Not on file  Social Connections: Not on file  Intimate Partner Violence: Not on file    Family History:   Family History  Problem Relation Age of Onset   Hypertension Mother      ROS:  Please see the history of present illness.  All other ROS reviewed and negative.     Physical Exam/Data:  Vitals:   07/19/21 1430 07/19/21 1500 07/19/21 1530 07/19/21 1600  BP: 137/64 127/68 (!) 141/69 135/76  Pulse: 99 (!) 102 (!) 104 (!) 117  Resp: 14 (!) 21 14 15   Temp:      TempSrc:      SpO2: 100% 100% 100% 100%   No intake or output data in the 24 hours ending 07/19/21 1618 Last 3 Weights 05/23/2017 05/12/2017 05/09/2017  Weight (lbs) 136 lb 3.9 oz 133 lb 4 oz 135 lb  Weight (kg) 61.8 kg 60.442 kg 61.236 kg     There is no height or weight on file to calculate BMI.  General:  Well nourished, well developed, in no acute distress HEENT: normal Neck: no JVD Vascular: No carotid bruits; Distal pulses 2+ bilaterally Cardiac:  normal S1, S2; RRR; no murmur   Lungs:  clear to auscultation bilaterally, no wheezing, rhonchi or rales  Abd: soft, nontender, no hepatomegaly  Ext: no edema Musculoskeletal:  No deformities, BUE and BLE strength normal and equal Skin: warm and dry  Neuro:  CNs 2-12 intact, no focal abnormalities noted Psych:  Normal affect   EKG:  The EKG was personally reviewed and demonstrates: Sinus tachycardia with nonspecific ST changes Telemetry:  Telemetry was personally reviewed and demonstrates: Sinus tachycardia/episode of SVT  Relevant CV Studies: As summarized above  Laboratory Data:  High Sensitivity Troponin:   Recent Labs  Lab 07/19/21 1024 07/19/21 1317  TROPONINIHS 7 7     Chemistry Recent Labs  Lab 07/19/21 1024  NA 139  K 4.0  CL 105  CO2 24  GLUCOSE 101*  BUN 12  CREATININE 0.69  CALCIUM 10.0  GFRNONAA >60  ANIONGAP 10    Recent Labs  Lab 07/19/21 1024  PROT 7.8  ALBUMIN 4.8  AST 18  ALT 18  ALKPHOS 37*  BILITOT 1.1    Hematology Recent Labs  Lab 07/19/21 1024  WBC 7.8  RBC 4.55  HGB 13.4  HCT 41.2  MCV 90.5  MCH 29.5  MCHC 32.5  RDW 12.8  PLT 258   Thyroid  Recent Labs  Lab 07/19/21 1024  TSH 1.554    BNPNo results for input(s): BNP, PROBNP in the last 168 hours.  DDimer  Recent Labs  Lab 07/19/21 1317  DDIMER <0.27     Radiology/Studies:  DG Chest 2 View  Result Date: 07/19/2021 CLINICAL DATA:  Chest pain. EXAM: CHEST - 2 VIEW COMPARISON:  February 05, 2014. FINDINGS: The heart size and mediastinal contours are within normal limits. Both lungs are clear. The visualized skeletal structures are unremarkable. IMPRESSION: No active cardiopulmonary disease. Electronically Signed   By: February 07, 2014 M.D.   On: 07/19/2021 10:36     Assessment and Plan:   Chest pressure Tachycardia   Symptoms started about a month ago as substernal chest tightness describing as a squeezing burning sensation.  This was radiating at temporal area.  Cannot tell if she had  associated palpitation or not.  Minimally improved after caffeine sensation.  However recurrent symptoms started 4 days ago with episode of elevated blood pressure.  Had a worse episode this morning.  Evidence of tachycardia in ER.  Seems underlying rhythm is SVT with slowly reduce heart rate.  TSH is normal. Get echocardiogram to rule out structural abnormality.  Likely needs monitor in outpatient setting.   Risk Assessment/Risk Scores:   HEAR Score (for undifferentiated chest pain):  HEAR Score: 2{  For questions or updates, please contact [facility]  Please consult [email or web address] for contact info under    Jarrett Soho, PA  07/19/2021 4:18 PM   ---------------------------------------------------------------------------------------------   History and all data above reviewed.  Patient examined.  I agree with the findings as above.  Beverly Banks is a 45 yo female presenting with tachycardia/palpitations and chest discomfort with radiation to the bilateral neck. Notes several episodes of palpitations - this is the prodrome, and then will have tachycardic, narrow complex rhythm that is very symptomatic with palpitations and headache. Also describing chest discomfort. However, some of these symptoms are different than what she experienced at home.   Constitutional: No acute distress Eyes: pupils equally round and reactive to light, sclera non-icteric, normal conjunctiva and lids ENMT: normal dentition, moist mucous membranes Cardiovascular: regular rhythm, normal rate, no murmurs. S1 and S2 normal. Radial pulses normal bilaterally. No jugular venous distention.  Respiratory: clear to auscultation bilaterally GI : normal bowel sounds, soft and nontender. No distention.   MSK: extremities warm, well perfused. No edema.  NEURO: grossly nonfocal exam, moves all extremities. PSYCH: alert and oriented x 3, normal mood and affect.   All available labs, radiology  testing, previous records reviewed. Agree with documented assessment and plan of my colleague as stated above with the following additions or changes:  Active Problems:   Narrow complex tachycardia (HCC)    Plan: She will need a structural evaluation with echo to ensure no congenital heart disease as source of narrow complex tachycardia in a young patient.  I also recommend 30 day event monitor for arrhythmia, suspect SVT.   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  From a cardiology standpoint she is appropriate for hospital dismissal from the ER.  Length of Stay:  LOS: 0 days   Elouise Munroe, MD [facility] 5:39 PM  07/19/2021

## 2021-07-19 NOTE — Progress Notes (Signed)
  Echocardiogram 2D Echocardiogram has been performed.  Roosvelt Maser F 07/19/2021, 5:16 PM

## 2021-07-19 NOTE — ED Notes (Signed)
Provider at bedside

## 2021-07-19 NOTE — ED Provider Notes (Signed)
Emergency Medicine Provider Triage Evaluation Note  Beverly Banks , a 45 y.o. female  was evaluated in triage.  Pt complains of chest burning and discomfort intermittently for the past week. States her BP has been elevated and she took a dose of her mother's captopril. She states some improvement in her headache which she describes as moderate and bitemporal since she took the captopril.   Seems the CP is sternal nonradiating and without agg/mitigating factors   Review of Systems  Positive: CP, Headache, fatigue Negative: fever  Physical Exam  LMP 07/07/2021  Gen:   Awake, no distress   Resp:  Normal effort  MSK:   Moves extremities without difficulty  Other:  Tachycardia, and soft nonttp  Medical Decision Making  Medically screening exam initiated at 10:16 AM.  Appropriate orders placed.  Beverly Banks was informed that the remainder of the evaluation will be completed by another provider, this initial triage assessment does not replace that evaluation, and the importance of remaining in the ED until their evaluation is complete.  Labs and imaging ordered   Gailen Shelter, [place] 07/19/21 1020    Milagros Loll, MD 07/20/21 2238

## 2021-07-19 NOTE — ED Notes (Signed)
Pt tried to sign for d/c but pad did not work.

## 2021-07-19 NOTE — ED Provider Notes (Signed)
  Physical Exam  BP 127/68   Pulse 81   Temp 98.8 F (37.1 C) (Oral)   Resp 11   LMP 07/07/2021   SpO2 99%   Physical Exam  ED Course/Procedures     Procedures  MDM  Care of patient assumed from previous provider at shift change. Please refer to their note for further history and plan.  In brief, patient is a 45 y.o. y/o female presenting with: - weeks of CP, tightness of head and temples (pulsing sensation), elevated BP - given prilosec and sertraline by PCP, symptoms worsened - PMHx: Past Medical History:  Diagnosis Date   No known health problems     Review of ED course: - unremarkable workup - tachycardic episodes to 150s in ED with return of symptoms   Plan at the time of handoff is as follows: - Cardiology consulted for EP workup - Pending cards recs - Restart PPI   Additional MDM:  VS upon my assumption of care were wnl. No further workup during my care.  Imaging: Echo with LVEF of 60 to 65%, normal LV and RV function, mild MV leaflet thickening, mild AV thickening, IVC normal. Imaging reviewed by radiology and personally by me.  Medications: Medications  metoprolol tartrate (LOPRESSOR) tablet 25 mg (25 mg Oral Given 07/19/21 1902)   Discussed case with EP, patient to be discharged home with metoprolol.  No indication for inpatient admission.  Given first dose in ED, as patient is unsure if she will be able to pick up the prescription tonight.  Patient re-evaluated prior to discharge.  No chest pain. Hemodynamically stable and in no acute distress.  Discharged home in stable condition. Rx Metoprolol BID.  EP/cardiology follow-up will be arranged.  Strict ED return precautions advised and discussed at length. Supportive care discussed. Advised close PCP follow up. Patient understands and agrees with the plan.   The plan for this patient was discussed with my attending physician, who voiced agreement and who oversaw evaluation and treatment of this patient.      Note: Chief Executive Officer was used in the creation of this note.   1. Tachycardia         Dwaine Gale, DO 07/19/21 2026    Blane Ohara, MD 07/19/21 [phone]

## 2021-07-19 NOTE — ED Triage Notes (Signed)
Pt. Stated, Beverly Banks had chest tightness and burning in my chest since last Wednesday. I also have High BP  and I took some of my moms BP medicine which made me feel better.

## 2021-07-20 DIAGNOSIS — I471 Supraventricular tachycardia: Secondary | ICD-10-CM | POA: Diagnosis not present

## 2021-07-20 DIAGNOSIS — R002 Palpitations: Secondary | ICD-10-CM | POA: Diagnosis not present

## 2021-07-21 ENCOUNTER — Telehealth: Payer: Self-pay | Admitting: Internal Medicine

## 2021-07-21 NOTE — Telephone Encounter (Signed)
Per Dr. Jacques Navy: "Ok to cut back to metoprolol 12.5 mg BID or stop and take PRN for palpitations, her choice.  She has close follow up in our office for HTN management and med management."   Patient was given the above information and verbalized understanding.

## 2021-07-21 NOTE — Telephone Encounter (Signed)
Pt c/o medication issue:  1. Name of Medication: metoprolol tartrate (LOPRESSOR) 25 MG tablet  2. How are you currently taking this medication (dosage and times per day)? As directed  3. Are you having a reaction (difficulty breathing--STAT)?   4. What is your medication issue? Patient is concerned that her HR is too low while on this medication. She wanted to know if she needed to reduce the amount of medication she is on. Please  advise   STAT if HR is under 50 or over 120 (normal HR is 60-100 beats per minute)  What is your heart rate? 61 138/82 Do you have a log of your heart rate readings (document readings)?   Do you have any other symptoms? tired

## 2021-07-21 NOTE — Telephone Encounter (Signed)
Returned patient's call. Patient reports mid chest burning and pressure, and feeling weak and disoriented. Today, at work, her B/P was 140/90, P 57. She was taken home by a coworker and is with husband at home. Reports dry mouth, B/P 138/85, P 60. I recommended her to go to the ED, but her concern was if she is on too much metoprolol tartrate, which she takes 25 mg twice a day. She saw her PCP yesterday. Advised patient I would send this message to PharmD and Dr. Jacques Navy. Also, patient is aware to keep her appointment on 11/28 with Edd Fabian.

## 2021-07-27 ENCOUNTER — Telehealth: Payer: Self-pay | Admitting: Internal Medicine

## 2021-07-27 NOTE — Telephone Encounter (Signed)
Went to [employer].   According to [employer] tracking, a label was made but it has not shipped.  Patient states, she called [employer] Friday 07/23/21 and confirmed address.  I apologize for the delay and contacted our [employer] representative to investigate.  He confirmed monitor will ship today.

## 2021-07-27 NOTE — Telephone Encounter (Signed)
Patient calling to find out the progress of where her heart monitor.

## 2021-07-28 ENCOUNTER — Ambulatory Visit (INDEPENDENT_AMBULATORY_CARE_PROVIDER_SITE_OTHER): Payer: BC Managed Care – PPO

## 2021-07-28 DIAGNOSIS — I471 Supraventricular tachycardia: Secondary | ICD-10-CM

## 2021-07-31 ENCOUNTER — Encounter (HOSPITAL_COMMUNITY): Payer: Self-pay | Admitting: Emergency Medicine

## 2021-07-31 ENCOUNTER — Emergency Department (HOSPITAL_COMMUNITY)
Admission: EM | Admit: 2021-07-31 | Discharge: 2021-07-31 | Disposition: A | Discharge status: Home / Self Care | Payer: BC Managed Care – PPO | Attending: Emergency Medicine | Admitting: Emergency Medicine

## 2021-07-31 ENCOUNTER — Emergency Department (HOSPITAL_COMMUNITY): Payer: BC Managed Care – PPO

## 2021-07-31 ENCOUNTER — Other Ambulatory Visit: Payer: Self-pay

## 2021-07-31 DIAGNOSIS — R519 Headache, unspecified: Secondary | ICD-10-CM | POA: Diagnosis not present

## 2021-07-31 DIAGNOSIS — R42 Dizziness and giddiness: Secondary | ICD-10-CM | POA: Insufficient documentation

## 2021-07-31 DIAGNOSIS — R0789 Other chest pain: Secondary | ICD-10-CM | POA: Insufficient documentation

## 2021-07-31 DIAGNOSIS — R079 Chest pain, unspecified: Secondary | ICD-10-CM

## 2021-07-31 DIAGNOSIS — R531 Weakness: Secondary | ICD-10-CM | POA: Diagnosis not present

## 2021-07-31 DIAGNOSIS — R002 Palpitations: Secondary | ICD-10-CM | POA: Diagnosis not present

## 2021-07-31 DIAGNOSIS — Z79899 Other long term (current) drug therapy: Secondary | ICD-10-CM | POA: Insufficient documentation

## 2021-07-31 DIAGNOSIS — R5383 Other fatigue: Secondary | ICD-10-CM | POA: Diagnosis not present

## 2021-07-31 DIAGNOSIS — R61 Generalized hyperhidrosis: Secondary | ICD-10-CM | POA: Diagnosis not present

## 2021-07-31 DIAGNOSIS — R000 Tachycardia, unspecified: Secondary | ICD-10-CM | POA: Diagnosis not present

## 2021-07-31 LAB — CBC
HCT: 42 % (ref 36.0–46.0)
Hemoglobin: 13.4 g/dL (ref 12.0–15.0)
MCH: 29.1 pg (ref 26.0–34.0)
MCHC: 31.9 g/dL (ref 30.0–36.0)
MCV: 91.1 fL (ref 80.0–100.0)
Platelets: 246 10*3/uL (ref 150–400)
RBC: 4.61 MIL/uL (ref 3.87–5.11)
RDW: 12.9 % (ref 11.5–15.5)
WBC: 9.4 10*3/uL (ref 4.0–10.5)
nRBC: 0 % (ref 0.0–0.2)

## 2021-07-31 LAB — BASIC METABOLIC PANEL
Anion gap: 9 (ref 5–15)
BUN: 14 mg/dL (ref 6–20)
CO2: 24 mmol/L (ref 22–32)
Calcium: 9.9 mg/dL (ref 8.9–10.3)
Chloride: 105 mmol/L (ref 98–111)
Creatinine, Ser: 0.8 mg/dL (ref 0.44–1.00)
GFR, Estimated: >60 mL/min (ref >60)
Glucose, Bld: 95 mg/dL (ref 70–99)
Potassium: 3.8 mmol/L (ref 3.5–5.1)
Sodium: 138 mmol/L (ref 135–145)

## 2021-07-31 LAB — TROPONIN I (HIGH SENSITIVITY)
Troponin I (High Sensitivity): <2 ng/L (ref <18)
Troponin I (High Sensitivity): <2 ng/L (ref <18)

## 2021-07-31 LAB — T4, FREE: Free T4: 0.94 ng/dL (ref 0.61–1.12)

## 2021-07-31 LAB — TSH: TSH: 1.516 u[IU]/mL (ref 0.350–4.500)

## 2021-07-31 LAB — I-STAT BETA HCG BLOOD, ED (MC, WL, AP ONLY): I-stat hCG, quantitative: <5 m[IU]/mL (ref <5)

## 2021-07-31 NOTE — ED Provider Notes (Signed)
Emergency Medicine Provider Triage Evaluation Note  Beverly Banks , a 45 y.o. female  was evaluated in triage.  Pt complains of burning in chest and fluttering in chest intermittently for one week. Episodes occur sometimes at night. Seem to occur with diaphoresis, burning in chest and SOB. States had an episode this afternoon 2pm until 20 min ago.  Currently feels light headed and fatigued but no CP or SOB currently. Feels somewhat disoriented/foggy. No HAs   Review of Systems  Positive: CP, Sob, sweating Negative: Fever   Physical Exam  BP (!) 141/62 (BP Location: Right Arm)   Pulse 75   Temp 98.1 F (36.7 C) (Oral)   Resp 17   LMP 07/07/2021   SpO2 100%  Gen:   Awake, no distress   Resp:  Normal effort  MSK:   Moves extremities without difficulty  Other:  RRR, no MRG  Medical Decision Making  Medically screening exam initiated at 4:55 PM.  Appropriate orders placed.  Cohen [name] was informed that the remainder of the evaluation will be completed by another provider, this initial triage assessment does not replace that evaluation, and the importance of remaining in the ED until their evaluation is complete.  Well appearing. Assessed by EP during last visit. Labs collected. DG Chest ordered.   Solon Augusta [place], [place] 07/31/21 1657    Terald Sleeper, MD 07/31/21 [phone]

## 2021-07-31 NOTE — ED Provider Notes (Signed)
[facility] EMERGENCY DEPARTMENT Provider Note   CSN: [id number] Arrival date & time: 07/31/21  1629     History Chief Complaint  Patient presents with   Chest Pain    Beverly Banks is a 45 y.o. female.  Beverly Banks is a 45 y.o. female with hx of tachycardia, who returns to the ED for evaluation of intermittent chest pains.  Patient was initially seen in the ED for similar symptoms 2 weeks ago, at that time she had reassuring work-up that did not suggest ACS, PE, she also had normal echocardiogram at that time and was seen and evaluated by cardiology.  She was noted to have an episode of narrow complex tachycardia up to 150 headaches.  Symptoms of chest tightness and head pressure at this time time.  She was started on metoprolol 25 mg twice daily, since starting this medication she has cut the dose in half as instructed by her cardiologist due to symptoms of fatigue and weakness.  She has also been wearing a 30-day cardiac monitor which she received earlier this week.  She reports she has continued to have symptoms of chest tightness that most often occur at night and wake her from sleep, they can last anywhere from an hour to 2 hours, they are often most severe when they first wake her from sleep and then wax and wane until they resolve on their own.  She reports initially when the symptoms started about a month ago she was having palpitations with them but with these most recent episodes since she has been placed on the metoprolol she has not been having palpitations as much and when she checks her heart rate during these episodes her heart rate has remained normal, blood pressure has been slightly elevated with systolic in the 0000000.  But despite taking the metoprolol she continues to have the episodes most nights and today also had an episode in the afternoon.  She is scheduled to follow-up with cardiology in 2 days but became concerned with the multiple  episodes she has had in the past 24 hours.  Currently denies any symptoms but when these episodes occur she has chest tightness and burning associated with headache and pressure at her temples and reports feeling hot and like her ears are underwater.  She did have a brief episode of lightheadedness with this overnight but has never lost consciousness.  Denies associated shortness of breath.  No vomiting.  Also reports sweating of her hands and feet during these episodes.  She reports the symptoms have not changed but they have been persistent.  When they first started she saw her PCP who placed her on a PPI and Zoloft but the patient stopped these medications after about a week because she felt that her symptoms are worsening.  Patient and her husband are very concerned this could be related to her thyroid.  No known history of thyroid disease.  The history is provided by the patient, the spouse and medical records.  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     Family History  Problem Relation Age of Onset   Hypertension Mother     Social History   Tobacco Use   Smoking status: Never   Smokeless tobacco: Never  Substance Use Topics   Alcohol use: Yes    Comment: occ   Drug use: No    Home Medications Prior to Admission medications   Medication Sig Start Date End Date Taking? Authorizing Provider  FINACEA 15 % FOAM Apply 1 g topically daily. 07/07/21   [provider]  ibuprofen  (ADVIL) 200 MG tablet Take 200 mg by mouth every 6 (six) hours as needed for headache or mild pain.    [provider]  meclizine (ANTIVERT) 25 MG tablet Take 1 tablet (25 mg total) by mouth 3 (three) times daily as needed for dizziness. Patient not taking: Reported on 07/19/2021 05/23/17   Nira Conn, MD  metoprolol tartrate (LOPRESSOR) 25 MG tablet Take one table by mouth twice a day. Take additional one table only if heart rate above 120 bpm 07/19/21   Bhagat, Bhavinkumar, PA  VALERIAN PO Take 1 capsule by mouth at bedtime as needed (To relax/sleep.).    [provider]    Allergies    Patient has no known allergies.  Review of Systems   Review of Systems  Constitutional:  Positive for diaphoresis and fatigue. Negative for chills and fever.  HENT: Negative.    Respiratory:  Negative for cough and shortness of breath.   Cardiovascular:  Positive for chest pain. Negative for palpitations and leg swelling.  Gastrointestinal:  Negative for abdominal pain, nausea and vomiting.  Genitourinary:  Negative for dysuria.  Musculoskeletal:  Negative for arthralgias and myalgias.  Skin:  Negative for color change and rash.  Neurological:  Positive for light-headedness and headaches. Negative for syncope, weakness and numbness.  All other systems reviewed and are negative.  Physical Exam Updated Vital Signs BP (!) 141/62 (BP Location: Right Arm)   Pulse 75   Temp 98.1 F (36.7 C) (Oral)   Resp 17   LMP 07/07/2021   SpO2 100%   Physical Exam Vitals and nursing note reviewed.  Constitutional:      General: She is not in acute distress.    Appearance: Normal appearance. She is well-developed and normal weight. She is not ill-appearing or diaphoretic.  HENT:     Head: Normocephalic and atraumatic.  Eyes:     General:        Right eye: No discharge.        Left eye: No discharge.     Pupils: Pupils are equal, round, and reactive to light.  Cardiovascular:      Rate and Rhythm: Normal rate and regular rhythm.     Pulses: Normal pulses.          Radial pulses are 2+ on the right side and 2+ on the left side.       Dorsalis pedis pulses are 2+ on the right side and 2+ on the left side.     Heart sounds: Normal heart sounds. No murmur heard.   No friction rub. No gallop.  Pulmonary:     Effort: Pulmonary effort is normal. No respiratory distress.     Breath sounds: Normal breath sounds. No wheezing or rales.     Comments: Respirations equal and unlabored, patient able  to speak in full sentences, lungs clear to auscultation bilaterally  Chest:     Chest wall: No tenderness.     Comments: 30 day cardiac monitor in place Abdominal:     General: Bowel sounds are normal. There is no distension.     Palpations: Abdomen is soft. There is no mass.     Tenderness: There is no abdominal tenderness. There is no guarding.     Comments: Abdomen soft, nondistended, nontender to palpation in all quadrants without guarding or peritoneal signs  Musculoskeletal:        General: No deformity.     Cervical back: Neck supple.     Right lower leg: No tenderness. No edema.     Left lower leg: No tenderness. No edema.  Skin:    General: Skin is warm and dry.     Capillary Refill: Capillary refill takes less than 2 seconds.  Neurological:     Mental Status: She is alert and oriented to person, place, and time.     Coordination: Coordination normal.     Comments: Speech is clear, able to follow commands Moves extremities without ataxia, coordination intact  Psychiatric:        Mood and Affect: Mood normal.        Behavior: Behavior normal.    ED Results / Procedures / Treatments   Labs (all labs ordered are listed, but only abnormal results are displayed) Labs Reviewed  BASIC METABOLIC PANEL  CBC  TSH  T4, FREE  I-STAT BETA HCG BLOOD, ED ([facility], [facility], [facility] ONLY)  TROPONIN I (HIGH SENSITIVITY)  TROPONIN I (HIGH SENSITIVITY)    EKG EKG  Interpretation  Date/Time:  Saturday July 31 2021 19:55:11 EST Ventricular Rate:  65 PR Interval:  178 QRS Duration: 112 QT Interval:  434 QTC Calculation: 451 R Axis:   88 Text Interpretation: Normal sinus rhythm Incomplete right bundle branch block Borderline ECG Since last tracing rate slower Confirmed by Jacalyn LefevreHaviland, Julie [phone]([id number]) on 07/31/2021 8:23:07 PM  Radiology DG Chest 2 View  Result Date: 07/31/2021 CLINICAL DATA:  Chest pain. EXAM: CHEST - 2 VIEW COMPARISON:  Chest x-ray 07/19/2021. FINDINGS: The heart size and mediastinal contours are within normal limits. Both lungs are clear. The visualized skeletal structures are unremarkable. IMPRESSION: No active cardiopulmonary disease. Electronically Signed   By: Darliss CheneyAmy  Guttmann M.D.   On: 07/31/2021 17:27    Procedures Procedures   Medications Ordered in ED Medications - No data to display  ED Course  I have reviewed the triage vital signs and the nursing notes.  Pertinent labs & imaging results that were available during my care of the patient were reviewed by me and considered in my medical decision making (see chart for details).    MDM Rules/Calculators/A&P                           Patient presents to the emergency department with intermittent chest pains. Currently pt is asymptomatic. Patient nontoxic appearing, in no apparent distress, vitals without significant abnormality. Fairly benign physical exam.   Patient's primary cardiologist: Dr. Jacques NavyAcharya  DDX: ACS, pulmonary embolism, dissection, pneumothorax, pneumonia, arrhythmia, severe anemia, MSK, GERD, anxiety, abdominal process .   Additional history obtained:  Additional history obtained from chart review & nursing note review.   EKG: Sinus rhythm with incomplete right bundle branch block, rate slower since last tracing but otherwise unchanged  Lab Tests:  I Ordered, reviewed, and interpreted labs,  which included:  CBC: No leukocytosis, normal  hemoglobin BMP: No electrolyte derangements, normal renal function Troponin: Negative x2 TSH and free T4: WNL  Imaging Studies ordered:  I ordered imaging studies which included CXR, I independently reviewed, formal radiology impression shows:  No active cardiopulmonary disease  ED Course:   EKG without obvious acute ischemia, delta troponin negative, doubt ACS. Patient is low risk wells, PERC negative, and had negative D-dimer during her last evaluation for similar symptoms, doubt PE.  Pain is not a tearing sensation, symmetric pulses, no widening of mediastinum on CXR, doubt dissection. Cardiac monitor reviewed, no notable arrhythmias and no episodes of tachycardia noted during patient's ED stay today.  Case discussed with Dr. Chancy Milroy with cardiology, he reports that they cannot obtain full report from patient's cardiac monitor, they do get alarm for high-grade heart block or ventricular tachycardia as and they have not received any alarm calls from Mrs. Longhi.  Cardiology does not recommend any further emergent work-up at this time and feels that patient's follow-up appointment in 2 days is appropriate.  Patient has appeared hemodynamically stable throughout ER visit and appears safe for discharge with close cardiology follow up in 2 days as planned. I discussed results, treatment plan, follow-up, and return precautions with the patient and her husband. Provided opportunity for questions, patient confirmed understanding and is in agreement with plan.    Portions of this note were generated with [employer]. Dictation errors may occur despite best attempts at proofreading.     Final Clinical Impression(s) / ED Diagnoses Final diagnoses:  Intermittent chest pain    Rx / DC Orders ED Discharge Orders     None        Jacqlyn Larsen, [place] 08/01/21 1547    Isla Pence, MD 08/01/21 1601

## 2021-07-31 NOTE — ED Triage Notes (Signed)
Pt states she was seen in the ED 2 weeks ago for chest pain.  Taking Metoprolol 12.5mg  BID since that visit.  Wearing a halter monitor since Thursday.  Reports tightness to center of chest, "bounding pain" that woke her up around midnight with cold/sweating hands and feet and feeling lightheaded.  States she has had 3-4 episodes of these symptoms lasting 1-2 hours each since then.  Denies pain at present.  Pt requesting to have thyroid labs.

## 2021-07-31 NOTE — Discharge Instructions (Signed)
Continue keeping a log of your symptoms and follow-up with cardiology in 2 days as planned.  When you have the symptoms make sure you are pressing the button on your cardiac monitor.  Your evaluation today has been reassuring.  If you experience new or worsening symptoms return to the ED for further evaluation.

## 2021-08-02 ENCOUNTER — Encounter (HOSPITAL_BASED_OUTPATIENT_CLINIC_OR_DEPARTMENT_OTHER): Payer: Self-pay | Admitting: General Practice

## 2021-08-02 ENCOUNTER — Ambulatory Visit (INDEPENDENT_AMBULATORY_CARE_PROVIDER_SITE_OTHER): Payer: BC Managed Care – PPO | Admitting: Nurse Practitioner

## 2021-08-02 ENCOUNTER — Other Ambulatory Visit: Payer: Self-pay

## 2021-08-02 VITALS — BP 112/60 | HR 62 | Ht 67.0 in | Wt 145.0 lb

## 2021-08-02 DIAGNOSIS — R072 Precordial pain: Secondary | ICD-10-CM | POA: Diagnosis not present

## 2021-08-02 DIAGNOSIS — R002 Palpitations: Secondary | ICD-10-CM | POA: Diagnosis not present

## 2021-08-02 DIAGNOSIS — I471 Supraventricular tachycardia: Secondary | ICD-10-CM

## 2021-08-02 DIAGNOSIS — F411 Generalized anxiety disorder: Secondary | ICD-10-CM | POA: Diagnosis not present

## 2021-08-02 DIAGNOSIS — F331 Major depressive disorder, recurrent, moderate: Secondary | ICD-10-CM | POA: Diagnosis not present

## 2021-08-02 DIAGNOSIS — Z79899 Other long term (current) drug therapy: Secondary | ICD-10-CM | POA: Diagnosis not present

## 2021-08-02 DIAGNOSIS — G4700 Insomnia, unspecified: Secondary | ICD-10-CM | POA: Diagnosis not present

## 2021-08-02 DIAGNOSIS — I10 Essential (primary) hypertension: Secondary | ICD-10-CM | POA: Diagnosis not present

## 2021-08-02 MED ORDER — PROPRANOLOL HCL 10 MG PO TABS: ORAL_TABLET | ORAL | 3 refills | Status: DC

## 2021-08-02 NOTE — Addendum Note (Signed)
Addended by: Ileene Musa D on: 08/02/2021 04:04 PM   Modules accepted: Orders

## 2021-08-02 NOTE — Progress Notes (Signed)
Cardiology Office Note:    Date:  08/02/2021   ID:  Beverly Banks, DOB 02/12/1976, MRN [id number]  PCP:  Fanny Bien, MD   [facility] Providers Cardiologist:  Elouise Munroe, MD {  Referring MD: Fanny Bien, MD   Chief complaint: follow-up of chest pain and fast HR  History of Present Illness:    Beverly Banks is a 45 y.o. female with a hx of narrow-complex tachycardia and recent intermittent chest pressure. She initially presented to [facility] ED on 07/19/21 and cardiology was asked to consult for evaluation of intermittent chest pressure and palpitations with tachycardia. She had no prior cardiac history. She started metoprolol tartrate 25 mg twice daily as advised by cardiology on 07/19/21. After 2 days of therapy, she called to report severe fatigue and called to get advice. She held the metoprolol for a few days, then resumed it at 12.5 mg twice daily approximately 5 days ago. She feels that it has helped with her symptoms of palpitations but she continues to feel very fatigued on occasion, most recently 2 days ago. She did a 3 mile walk/light job on 07/29/21 and tolerated the activity without incidence.  On 07/31/21, she went to the [facility] ED for evaluation of intermittent chest pain with concerns for an increase in the number of episodes of chest tightness, burning, fast HR accompanied by headache and pressure in her temples that awaken her from sleep. She had no evidence of ACS and was discharged and advised to follow-up with [place] today as previously scheduled. Took a Pepcid for some additional chest burning with some relief. She is wearing a cardiac monitor.  Today, she is here alone. She denies shortness of breath, lower extremity edema,melena, hematuria, hemoptysis, diaphoresis, weakness, presyncope, syncope, orthopnea, and PND. She is pain free at present but has intermittent episodes of chest pain and burning and she continues to struggle with fatigue.  Feels that palpitations have improved on metoprolol but does not feel like "doing anything" and feels that her HR is too low (50-60) on metoprolol.   Past Medical History:  Diagnosis Date   No known health problems     Past Surgical History:  Procedure Laterality Date   CESAREAN SECTION     TONSILLECTOMY      Current Medications: Current Meds  Medication Sig   FINACEA 15 % FOAM Apply 1 g topically daily.   propranolol (INDERAL) 10 MG tablet May take 10mg  tablet up to 4 times per day as needed   [DISCONTINUED] metoprolol tartrate (LOPRESSOR) 25 MG tablet Take one table by mouth twice a day. Take additional one table only if heart rate above 120 bpm     Allergies:   Patient has no known allergies.   Social History   Socioeconomic History   Marital status: Married    Spouse name: Not on file   Number of children: Not on file   Years of education: Not on file   Highest education level: Not on file  Occupational History   Not on file  Tobacco Use   Smoking status: Never   Smokeless tobacco: Never  Substance and Sexual Activity   Alcohol use: Yes    Comment: occ   Drug use: No   Sexual activity: Not on file  Other Topics Concern   Not on file  Social History Narrative   Not on file   Social Determinants of Health   Financial Resource Strain: Not on file  Food Insecurity:  Not on file  Transportation Needs: Not on file  Physical Activity: Not on file  Stress: Not on file  Social Connections: Not on file     Family History: The patient's family history includes Hypertension in her mother.  ROS:   Please see the history of present illness. All other systems reviewed and are negative.  Labs/Other Studies Reviewed:    The following studies were reviewed today:  Echo 07/19/21 Left Ventricle: Left ventricular ejection fraction, by estimation, is 60  to 65%. The left ventricle has normal function. The left ventricle has no  regional wall motion abnormalities. The  left ventricular internal cavity  size was normal in size. There is  no left ventricular hypertrophy. Left ventricular diastolic parameters were normal.  Right Ventricle: The right ventricular size is normal. No increase in  right ventricular wall thickness. Right ventricular systolic function is  normal. There is normal pulmonary artery systolic pressure. The tricuspid  regurgitant velocity is 1.95 m/s, and   with an assumed right atrial pressure of 3 mmHg, the estimated right  ventricular systolic pressure is 123XX123 mmHg.  Left Atrium: Left atrial size was normal in size.  Right Atrium: Right atrial size was normal in size.  Pericardium: There is no evidence of pericardial effusion.  Mitral Valve: There is mild thickening of the mitral valve leaflet(s). No  evidence of mitral valve regurgitation. No evidence of mitral valve  stenosis.  Tricuspid Valve: The tricuspid valve is normal in structure. Tricuspid  valve regurgitation is mild . No evidence of tricuspid stenosis.  Aortic Valve: There is mild thickening of the aortic valve. Aortic valve  regurgitation is not visualized. No aortic stenosis is present. Aortic  valve mean gradient measures 4.0 mmHg. Aortic valve peak gradient measures 7.3 mmHg. Aortic valve area, by VTI   measures 2.47 cm.  Pulmonic Valve: The pulmonic valve was not well visualized. Pulmonic valve  regurgitation is not visualized. No evidence of pulmonic stenosis.  Aorta: The aortic root is normal in size and structure.  Venous: The inferior vena cava is normal in size with greater than 50%  respiratory variability, suggesting right atrial pressure of 3 mmHg   Addendum note from Dr. 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    Chest X-ray 07/31/21  FINDINGS: The heart size and mediastinal contours are within normal limits. Both lungs are clear. The visualized skeletal structures are unremarkable.   IMPRESSION: No active cardiopulmonary disease.   Recent Labs: 07/19/2021: ALT 18 07/31/2021: BUN 14; Creatinine, Ser 0.80; Hemoglobin 13.4; Platelets 246; Potassium 3.8; Sodium 138; TSH 1.516   Recent Lipid Panel No results found for: CHOL, TRIG, HDL, CHOLHDL, VLDL, LDLCALC, LDLDIRECT   Physical Exam:    VS:  BP 112/60   Pulse 62   Ht 5\' 7"  (1.702 m)   Wt 145 lb (65.8 kg)   LMP 07/07/2021   BMI 22.71 kg/m     Wt Readings from Last 3 Encounters:  08/02/21 145 lb (65.8 kg)  05/23/17 136 lb 3.9 oz (61.8 kg)  05/12/17 133 lb 4 oz (60.4 kg)     GEN:  Well nourished, well developed in no acute distress HEENT: Normal NECK: No JVD; No carotid bruits LYMPHATICS: No lymphadenopathy CARDIAC: RRR, no murmurs, rubs, gallops RESPIRATORY:  Clear to auscultation  without rales, wheezing or rhonchi  ABDOMEN: Soft, non-tender, non-distended MUSCULOSKELETAL:  No edema; No deformity  SKIN: Warm and dry NEUROLOGIC:  Alert and oriented x 3 PSYCHIATRIC:  Normal affect   EKG:  EKG is ordered today.  The ekg ordered today demonstrates NSR at rate of 62 bpm, no ST/TW abnormalities.   Diagnoses:    1. Heart palpitations   2. Precordial pain   3. Narrow complex tachycardia (HCC)    Assessment and Plan:    Chest pain: Currently pain free. One month history of chest pain that awakens her from sleep. Has had 2 visits to ED. Dr. Jacques Navy, primary cardiologist has recommended that we order a coronary CT and patient would like to proceed. Since we are adjusting beta blocker therapy today, she will call one week prior to date of test for advice regarding medication dose for the CT per guidelines.   Tachycardia: She continues to have episodes of fast HR that occur intermittently. Feels that she is not  tolerating lower HR of 50s and 60s since starting metoprolol. We reduced metoprolol dose and she continued to feel fatigued. HR today is 62 with last dose metoprolol 12+ hours ago. Discussed treatment options and she would like to try low dose propranolol as needed. She will monitor and call us prior to follow-up if no improvement. We will await the results of her cardiac monitor in about 3-4 weeks. Stop metoprolol (fatigue). Start propranolol 10 mg up to 4 times per say as needed.   Palpitations: Palpitations have improved on metoprolol but she feels very limited by fatigue. Awaiting cardiac monitor results. Will switch from low dose metoprolol tartrate to low dose propranolol as needed. Advised her to call back if symptoms worsen. Could try metoprolol succinate at bedtime if she does not tolerate propranolol.   Disposition: Follow-up in 2-3 months after CT and cardiac monitor  Medication Adjustments/Labs and Tests Ordered: Current medicines are reviewed at length with the patient today.  Concerns regarding medicines are outlined above.  Orders Placed This Encounter  Procedures   CT CORONARY MORPH W/CTA COR W/SCORE W/CA W/CM &/OR WO/CM   Basic metabolic panel    Meds ordered this encounter  Medications   propranolol (INDERAL) 10 MG tablet    Sig: May take 10mg  tablet up to 4 times per day as needed    Dispense:  90 tablet    Refill:  3     Patient Instructions  Medication Instructions:  Your physician has recommended you make the following change in your medication:   Please stop taking Metoprolol and start taking Propranolol 10mg  up to 4 times per day as needed   *If you need a refill on your cardiac medications before your next appointment, please call your pharmacy*   Lab Work: Please return for Lab work within the next week for a BMP. You may come to the...   [address] [facility] (3rd floor) [address] , [place], [place]   [facility] at  [facility] [address] [facility]- Any location.  If you have labs (blood work) drawn today and your tests are completely normal, you will receive your results only by: MyChart Message (if you have MyChart) OR A paper copy in the mail If you have any lab test that is abnormal or we need to change your treatment, we will call you to review the results.   Testing/Procedures: Please call [facility] ones week prior to test for dosing instructions  on Propranolol dosing for procedure.    Your cardiac CT will be scheduled at one of the below locations:   [facility] [address] [place], [place] [address] [phone]   If scheduled at [facility], please arrive at the [facility] main entrance (entrance A) of [facility] 30 minutes prior to test start time. You can use the FREE valet parking offered at the main entrance (encouraged to control the heart rate for the test) Proceed to the [facility] Radiology Department (first floor) to check-in and test prep.  Please follow these instructions carefully (unless otherwise directed):  On the Night Before the Test: Be sure to Drink plenty of water. Do not consume any caffeinated/decaffeinated beverages or chocolate 12 hours prior to your test. Do not take any antihistamines 12 hours prior to your test.  On the Day of the Test: Drink plenty of water until 1 hour prior to the test. Do not eat any food 4 hours prior to the test. You may take your regular medications prior to the test.  Take metoprolol (Lopressor) two hours prior to test. HOLD Furosemide/Hydrochlorothiazide morning of the test. FEMALES- please wear underwire-free bra if available, avoid dresses & tight clothing      After the Test: Drink plenty of water. After receiving IV contrast, you may experience a mild flushed feeling. This is normal. On occasion, you may experience a mild rash up to 24 hours after the test. This is not  dangerous. If this occurs, you can take Benadryl 25 mg and increase your fluid intake. If you experience trouble breathing, this can be serious. If it is severe call 911 IMMEDIATELY. If it is mild, please call our office. If you take any of these medications: Glipizide/Metformin, Avandament, Glucavance, please do not take 48 hours after completing test unless otherwise instructed.  Please allow 2-4 weeks for scheduling of routine cardiac CTs. Some insurance companies require a pre-authorization which may delay scheduling of this test.   For non-scheduling related questions, please contact the cardiac imaging nurse navigator should you have any questions/concerns: Marchia Bond, Cardiac Imaging Nurse Navigator Gordy Clement, Cardiac Imaging Nurse Navigator [facility] [facility] Direct Office Dial: [phone]   For scheduling needs, including cancellations and rescheduling, please call [place], [phone].    Follow-Up: At [facility], you and your health needs are our priority.  As part of our continuing mission to provide you with exceptional heart care, we have created designated Provider Care Teams.  These Care Teams include your primary Cardiologist (physician) and Advanced Practice Providers (APPs -  Physician Assistants and Nurse Practitioners) who all work together to provide you with the care you need, when you need it.  We recommend signing up for the patient portal called "MyChart".  Sign up information is provided on this After Visit Summary.  MyChart is used to connect with patients for Virtual Visits (Telemedicine).  Patients are able to view lab/test results, encounter notes, upcoming appointments, etc.  Non-urgent messages can be sent to your provider as well.   To learn more about what you can do with MyChart, go to [email or web address].    Your next appointment:   January 2023  The format for your next appointment:   In Person  Provider:    Laurann Montana, NP or Coletta Memos, NP    Other Instructions    Signed, Emmaline Life, NP  08/02/2021 12:25 PM    [place]

## 2021-08-02 NOTE — Patient Instructions (Signed)
Medication Instructions:  Your physician has recommended you make the following change in your medication:   Please stop taking Metoprolol and start taking Propranolol 10mg  up to 4 times per day as needed   *If you need a refill on your cardiac medications before your next appointment, please call your pharmacy*   Lab Work: Please return for Lab work within the next week for a BMP. You may come to the...   [address] [facility] (3rd floor) [address] , [place], [place]   [facility] at [facility] [address] [facility]- Any location.  If you have labs (blood work) drawn today and your tests are completely normal, you will receive your results only by: MyChart Message (if you have MyChart) OR A paper copy in the mail If you have any lab test that is abnormal or we need to change your treatment, we will call you to review the results.   Testing/Procedures: Please call [place] ones week prior to test for dosing instructions on Propranolol dosing for procedure.    Your cardiac CT will be scheduled at one of the below locations:   [facility] [address] [place], [place] [phone]   If scheduled at [facility], please arrive at the [facility] main entrance (entrance A) of [facility] 30 minutes prior to test start time. You can use the FREE valet parking offered at the main entrance (encouraged to control the heart rate for the test) Proceed to the [facility] Radiology Department (first floor) to check-in and test prep.  Please follow these instructions carefully (unless otherwise directed):  On the Night Before the Test: Be sure to Drink plenty of water. Do not consume any caffeinated/decaffeinated beverages or chocolate 12 hours prior to your test. Do not take any antihistamines 12 hours prior to your test.  On the Day of the Test: Drink plenty of water until 1 hour prior to the  test. Do not eat any food 4 hours prior to the test. You may take your regular medications prior to the test.  Take metoprolol (Lopressor) two hours prior to test. HOLD Furosemide/Hydrochlorothiazide morning of the test. FEMALES- please wear underwire-free bra if available, avoid dresses & tight clothing      After the Test: Drink plenty of water. After receiving IV contrast, you may experience a mild flushed feeling. This is normal. On occasion, you may experience a mild rash up to 24 hours after the test. This is not dangerous. If this occurs, you can take Benadryl 25 mg and increase your fluid intake. If you experience trouble breathing, this can be serious. If it is severe call 911 IMMEDIATELY. If it is mild, please call our office. If you take any of these medications: Glipizide/Metformin, Avandament, Glucavance, please do not take 48 hours after completing test unless otherwise instructed.  Please allow 2-4 weeks for scheduling of routine cardiac CTs. Some insurance companies require a pre-authorization which may delay scheduling of this test.   For non-scheduling related questions, please contact the cardiac imaging nurse navigator should you have any questions/concerns: [facility], Cardiac Imaging Nurse Navigator [place], Cardiac Imaging Nurse Navigator [place] [facility] Direct Office Dial: [phone]   For scheduling needs, including cancellations and rescheduling, please call [phone], [phone].    Follow-Up: At [facility], you and your health needs are our priority.  As part of our continuing mission to provide you with exceptional  heart care, we have created designated Provider Care Teams.  These Care Teams include your primary Cardiologist (physician) and Advanced Practice Providers (APPs -  Physician Assistants and Nurse Practitioners) who all work together to provide you with the care you need, when you need it.  We recommend signing  up for the patient portal called "MyChart".  Sign up information is provided on this After Visit Summary.  MyChart is used to connect with patients for Virtual Visits (Telemedicine).  Patients are able to view lab/test results, encounter notes, upcoming appointments, etc.  Non-urgent messages can be sent to your provider as well.   To learn more about what you can do with MyChart, go to [email or web address].    Your next appointment:   January 2023  The format for your next appointment:   In Person  Provider:   Gillian Shields, NP or Edd Fabian, NP    Other Instructions

## 2021-08-18 ENCOUNTER — Telehealth: Payer: Self-pay | Admitting: Internal Medicine

## 2021-08-18 DIAGNOSIS — G4700 Insomnia, unspecified: Secondary | ICD-10-CM | POA: Diagnosis not present

## 2021-08-18 DIAGNOSIS — F410 Panic disorder [episodic paroxysmal anxiety] without agoraphobia: Secondary | ICD-10-CM | POA: Diagnosis not present

## 2021-08-18 DIAGNOSIS — I10 Essential (primary) hypertension: Secondary | ICD-10-CM | POA: Diagnosis not present

## 2021-08-18 DIAGNOSIS — Z23 Encounter for immunization: Secondary | ICD-10-CM | POA: Diagnosis not present

## 2021-08-18 DIAGNOSIS — R002 Palpitations: Secondary | ICD-10-CM | POA: Diagnosis not present

## 2021-08-18 DIAGNOSIS — F411 Generalized anxiety disorder: Secondary | ICD-10-CM | POA: Diagnosis not present

## 2021-08-18 MED ORDER — CARVEDILOL 3.125 MG PO TABS: 3.1250 mg | ORAL_TABLET | Freq: Two times a day (BID) | ORAL | 6 refills | Status: DC

## 2021-08-18 NOTE — Telephone Encounter (Signed)
Spoke to DOD Dr.Tobb she advised to take Coreg 3.125 mg twice a day.Advised to monitor B/P and bring readings to appointment scheduled with Marjie Skiff PA 1/3 at 8:25 am.

## 2021-08-18 NOTE — Telephone Encounter (Signed)
Spoke to patient she stated she saw PCP about 2 weeks ago and she prescribed Xanax and Zoloft for anxiety.Stated both have helped her chest tightness.Stated PCP stopped Metoprolol due to low pulse.Stated pulse ranging in low 50's and she felt tired.Stated for the past 1 week her B/P has been elevated ranging 155/100,135/90,133/92,160/90,140/90,147/92.Pulse 76,93,73,86,80,93.Advised Dr.Acharya is out of office.I will speak to DOD.

## 2021-08-18 NOTE — Telephone Encounter (Signed)
°  Pt c/o medication issue:  1. Name of Medication: metoprolol   2. How are you currently taking this medication (dosage and times per day)?   3. Are you having a reaction (difficulty breathing--STAT)?   4. What is your medication issue? Pt said this meds did help her BP controlled but it makes her HR low, sometimes it goes lower than 50. She also said she is taking zolof and xanax now and that helps her chest tightness. She also wants to ask about her heart monitor, she wanted to know when she needs to take it off

## 2021-08-26 DIAGNOSIS — F410 Panic disorder [episodic paroxysmal anxiety] without agoraphobia: Secondary | ICD-10-CM | POA: Diagnosis not present

## 2021-08-29 NOTE — Progress Notes (Deleted)
Cardiology Office Note:    Date:  08/29/2021   ID:  Beverly Banks, DOB 1975-11-13, MRN [id number]  PCP:  Fanny Bien, MD  Cardiologist:  Elouise Munroe, MD  Electrophysiologist:  None   Referring MD: No ref. provider found   Chief Complaint: follow-up of chest pain and palpitations   History of Present Illness:    Beverly Banks is a 45 y.o. female with a history of palpitations with narrow complex tachycardia (likely SVT) noted during recent ED visit in 07/2021 who presents today for follow-up of chest pain and palpitations.  Patient was recently seen in the ED on 07/19/2021 for evaluation of tachycardia. Patient had no significant past medical history prior to this. She reported mild substernal chest tightness with possible palpitations for about the past month and then she reported intermittent burning/squeezing chest pressure that radiated to her head with associated diaphoresis for a few days prior to ED visit. EKG in the showed sinus tachycardia with non-specific ST changes. High-sensitivity troponin was negative. While in the ED, she had a narrow complex tachycardia with rates as high as the 150s which was felt to likely be SVT. Cardiology was consulted and Echo showed LVEF of 60-65% with normal wall motion and diastolic function, normal RV, and no significant valvular disease. Outpatient Event Monitor was ordered for further evaluation. She was started on Lopressor but had significant fatigue on this so dose was decreased in half.  She was seen back in the ED on 07/31/2021 for continued intermittent chest tightness. EKG showed no acute ischemic changes. High-sensitivity troponin negative x2 at <2. Remainder of labs including BMET, CBC, TSH, free T4 all unremarkable as well. Patient was already scheduled to have follow-up with Cardiology and she was felt ot be stable for discharge.  She was seen by Christen Bame, NP, on 08/02/2021 at which time she continued to  report intermittent episode of chest pain/burning as well as fatigue. Heart rate was in the low 60s. Metoprolol was stopped and she was started on as needed Propranolol for palpitations. Plan was to wait for monitor results. Coronary CTA was also ordered for further evaluation of chest pain. Following this visit, she called our office on 08/18/2021 with reports of elevated BP over the last week with systolic BP in the [id number] to [id number] and diastolic BP in the 0000000 to 100s. She was started on Coreg.  Patient presents today for follow-up. ***  Chest Pain Patient has had intermittent chest pain/tightness/burning over the last couple of months for which she has been seen in the ED multiple times. High-sensitivity troponin negative and EKG showed no acute ischemic changes. Echo showed normal LV function and wall motion. Coronary CTA ordered at last office visit but has not been done yet. ***   Palpitations  Suspected SVT During one of ED visits, patient was noted to have a narrow complex tachycardia with rates as high as the 150s. Felt to likely be SVT. Event Monitor ordered and pending. *** - *** - Continue Coreg 3.125mg  twice daily. Also has PRN Propranolol for tachypalpitations.   Hypertension *** - Continue Coreg 3.125mg  twice daily.  Past Medical History:  Diagnosis Date   No known health problems     Past Surgical History:  Procedure Laterality Date   CESAREAN SECTION     TONSILLECTOMY      Current Medications: No outpatient medications have been marked as taking for the 09/07/21 encounter (Appointment) with Darreld Mclean, PA-C.  Allergies:   Patient has no known allergies.   Social History   Socioeconomic History   Marital status: Married    Spouse name: Not on file   Number of children: Not on file   Years of education: Not on file   Highest education level: Not on file  Occupational History   Not on file  Tobacco Use   Smoking status: Never   Smokeless tobacco:  Never  Substance and Sexual Activity   Alcohol use: Yes    Comment: occ   Drug use: No   Sexual activity: Not on file  Other Topics Concern   Not on file  Social History Narrative   Not on file   Social Determinants of Health   Financial Resource Strain: Not on file  Food Insecurity: Not on file  Transportation Needs: Not on file  Physical Activity: Not on file  Stress: Not on file  Social Connections: Not on file     Family History: The patient's family history includes Hypertension in her mother.  ROS:   Please see the history of present illness.     EKGs/Labs/Other Studies Reviewed:    The following studies were reviewed today:  Echocardiogram 07/19/2021: Impressions:  1. Left ventricular ejection fraction, by estimation, is 60 to 65%. The  left ventricle has normal function. The left ventricle has no regional  wall motion abnormalities. Left ventricular diastolic parameters were  normal.   2. Right ventricular systolic function is normal. The right ventricular  size is normal. There is normal pulmonary artery systolic pressure.   3. There is mild thickening of the mitral valve leaflets. No evidence of  mitral valve regurgitation. No evidence of mitral stenosis.   4. There is mild thickening of the aortic valve. Aortic valve  regurgitation is not visualized. No aortic stenosis is present.   5. The inferior vena cava is normal in size with greater than 50%  respiratory variability, suggesting right atrial pressure of 3 mmHg.   Comparison(s): No prior Echocardiogram.   EKG:  EKG not ordered today.   Recent Labs: 07/19/2021: ALT 18 07/31/2021: BUN 14; Creatinine, Ser 0.80; Hemoglobin 13.4; Platelets 246; Potassium 3.8; Sodium 138; TSH 1.516  Recent Lipid Panel No results found for: CHOL, TRIG, HDL, CHOLHDL, VLDL, LDLCALC, LDLDIRECT  Physical Exam:    Vital Signs: There were no vitals taken for this visit.    Wt Readings from Last 3 Encounters:  08/02/21  145 lb (65.8 kg)  05/23/17 136 lb 3.9 oz (61.8 kg)  05/12/17 133 lb 4 oz (60.4 kg)     General: 45 y.o. female in no acute distress. HEENT: Normocephalic and atraumatic. Sclera clear. EOMs intact. Neck: Supple. No carotid bruits. No JVD. Heart: *** RRR. Distinct S1 and S2. No murmurs, gallops, or rubs. Radial and distal pedal pulses 2+ and equal bilaterally. Lungs: No increased work of breathing. Clear to ausculation bilaterally. No wheezes, rhonchi, or rales.  Abdomen: Soft, non-distended, and non-tender to palpation. Bowel sounds present in all 4 quadrants.  MSK: Normal strength and tone for age. *** Extremities: No lower extremity edema.    Skin: Warm and dry. Neuro: Alert and oriented x3. No focal deficits. Psych: Normal affect. Responds appropriately.   Assessment:    No diagnosis found.  Plan:     Disposition: Follow up in ***   Medication Adjustments/Labs and Tests Ordered: Current medicines are reviewed at length with the patient today.  Concerns regarding medicines are outlined above.  No orders of  the defined types were placed in this encounter.  No orders of the defined types were placed in this encounter.   There are no Patient Instructions on file for this visit.   Signed, Darreld Mclean, PA-C  08/29/2021 10:03 PM    [facility]

## 2021-09-02 DIAGNOSIS — F410 Panic disorder [episodic paroxysmal anxiety] without agoraphobia: Secondary | ICD-10-CM | POA: Diagnosis not present

## 2021-09-06 NOTE — Progress Notes (Addendum)
Office Visit    Patient Name: Beverly Banks Date of Encounter: 09/07/2021  Primary Care Provider:  Fanny Bien, MD Primary Cardiologist:  Beverly Munroe, MD  Chief Complaint   46 year old female with a history of narrow-complex tachycardia, palpitations, and chest pain who presents for follow-up related to chest pain and palpitations.   Past Medical History    Past Medical History:  Diagnosis Date   No known health problems    Past Surgical History:  Procedure Laterality Date   CESAREAN SECTION     TONSILLECTOMY      Allergies  No Known Allergies  History of Present Illness   46 year old female with the above past medical history including narrow-complex tachycardia, palpitations, and chest pain. She was first evaluated by cardiology in the ED on 07/19/2021 in th setting of intermittent chest pressure, palpitations, and sudden onset tachycardia (thought to be SVT). D. Dimer, Troponin, and TSH were negative. Echo showed an EF of 60-65%, and per Dr. 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 She was started on Metoprolol and a 30-day event monitor was placed, with consideration for exercise treadmill test as an outpatient at follow-up. She presented to the ED again on 07/31/21 with recurrent symptoms. ED workup was unremarkable at the time. She was discharged home and advised to follow-up with cardiology as an outpatient.   She was seen in the office for follow-up on 08/02/2021. Her metoprolol was switched to as needed propanolol in the setting of significant fatigue on beta-blocker therapy. With recurrent chest pain and new-onset tachycardia, a coronary CTA was ordered for further evaluation/risk stratification. Coronary CTA and 30-day monitor results pending completion. She later called our office on 08/18/21 with concern for elevated BP readings. She was started on Coreg 3.125 bid.   Since her last visit  she reports feeling much better.  She saw her PCP who started her on Xanax and Zoloft for anxiety.  Since this time, she has had no recurrent chest tightness and her blood pressure has been well controlled.  She never took carvedilol given her history of significant fatigue on beta-blocker therapy and improvement in symptoms on SSRI. She has only used her propanolol as needed, 2-3 times in the past month for rare palpitations.  She has no concerns today and reports feeling well overall.  She is eager to begin exercising again. Current Outpatient Medications  Medication Sig Dispense Refill   ALPRAZolam (XANAX) 0.25 MG tablet 0.25 mg.     FINACEA 15 % FOAM Apply 1 g topically daily.     propranolol (INDERAL) 10 MG tablet May take 10mg  tablet up to 4 times per day as needed 90 tablet 3   sertraline (ZOLOFT) 25 MG tablet Take 25 mg by mouth daily.     No current facility-administered medications for this visit.     Review of Systems    She denies chest pain, palpitations, dyspnea, pnd, orthopnea, n, v, dizziness, syncope, edema, weight gain, or early satiety. All other systems reviewed and are otherwise negative except as noted above.   Physical Exam    VS:  BP 126/72 (BP Location: Left Arm, Patient Position: Sitting, Cuff Size: Normal)    Pulse 72    Resp 20    Ht 5\' 6"  (1.676 m)    Wt 143 lb (64.9 kg)    LMP 08/24/2021 (Approximate)    SpO2 99%    BMI 23.08 kg/m   GEN: Well  nourished, well developed, in no acute distress. HEENT: normal. Neck: Supple, no JVD, carotid bruits, or masses. Cardiac: RRR, no murmurs, rubs, or gallops. No clubbing, cyanosis, edema.  Radials/DP/PT 2+ and equal bilaterally.  Respiratory:  Respirations regular and unlabored, clear to auscultation bilaterally. GI: Soft, nontender, nondistended, BS + x 4. MS: no deformity or atrophy. Skin: warm and dry, no rash. Neuro:  Strength and sensation are intact. Psych: Normal affect.  Accessory Clinical Findings    ECG  personally reviewed by me today -No EKG in office today.   Lab Results  Component Value Date   WBC 9.4 07/31/2021   HGB 13.4 07/31/2021   HCT 42.0 07/31/2021   MCV 91.1 07/31/2021   PLT 246 07/31/2021   Lab Results  Component Value Date   CREATININE 0.80 07/31/2021   BUN 14 07/31/2021   NA 138 07/31/2021   K 3.8 07/31/2021   CL 105 07/31/2021   CO2 24 07/31/2021   Lab Results  Component Value Date   ALT 18 07/19/2021   AST 18 07/19/2021   ALKPHOS 37 (L) 07/19/2021   BILITOT 1.1 07/19/2021   No results found for: CHOL, HDL, LDLCALC, LDLDIRECT, TRIG, CHOLHDL  No results found for: HGBA1C  Assessment & Plan    1. Atypical chest pain:  H/o atypical chest pain.  ED work-ups unremarkable. Improved since starting SSRI/Xanax. Likely in the setting of anxiety. Coronary CTA pending. 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   Shared Decision Making/Informed Consent The risks [chest pain, shortness of breath, cardiac arrhythmias, dizziness, blood pressure fluctuations, myocardial infarction, stroke/transient ischemic attack, nausea, vomiting, allergic reaction, radiation exposure, metallic taste sensation and life-threatening complications (estimated to be 1 in 10,000)], benefits (risk stratification, diagnosing coronary artery disease, treatment guidance) and alternatives of a nuclear stress test were discussed in detail with [name] and she agrees to proceed.  2. Palpitations/Narrow complex tachycardia: Associated with anxiety, though she is at increased risk for cardiac arrhythmias given abnormal echocardiogram as above. Official monitor results/MD report pending, however, preliminary read shows minimum HR 44, Max HR 169 bpm, average HR 71 bpm. 79 patient triggered events, mostly correlated to normal  sinus rhythm. No a fib. Reassuring.  Exercise Myoview pending as above.  Not on maintenance beta-blocker therapy due to significant fatigue.  Continue propanolol as needed.  3. 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  Per Dr. Elson Clan recommendation, will order exercise Myoview for further risk stratification.   4. Anxiety: Likely a contributing factor to her symptoms of chest pain, tachycardia, and heart palpitations. Much improved on SSRI and Xanax.  Continue to follow with PCP.  5. Disposition: Follow-up in 3 to 4 months with Dr. Margaretann Loveless.   Lenna Sciara, NP 09/07/2021, 1:18 PM  6. Addendum: Case discussed with Dr. Margaretann Loveless, who reviewed 30 day monitor results and note from yesterday's visit. Per Dr. Delphina Banks recommendation, given improvement in symptoms and reassuring monitor results, we will cancel the stress test. If she has recurrent chest pain or palpitations, could consider treadmill ETT in the future for further risk stratification. Patient was called and notified of plan. She verbalized understanding.   Lenna Sciara, NP 09/08/2021, 10:40 AM

## 2021-09-07 ENCOUNTER — Other Ambulatory Visit: Payer: Self-pay

## 2021-09-07 ENCOUNTER — Encounter: Payer: Self-pay | Admitting: Student

## 2021-09-07 ENCOUNTER — Ambulatory Visit (INDEPENDENT_AMBULATORY_CARE_PROVIDER_SITE_OTHER): Payer: BC Managed Care – PPO | Admitting: Nurse Practitioner

## 2021-09-07 VITALS — BP 126/72 | HR 72 | Resp 20 | Ht 66.0 in | Wt 143.0 lb

## 2021-09-07 DIAGNOSIS — R072 Precordial pain: Secondary | ICD-10-CM

## 2021-09-07 DIAGNOSIS — R002 Palpitations: Secondary | ICD-10-CM | POA: Diagnosis not present

## 2021-09-07 DIAGNOSIS — I471 Supraventricular tachycardia: Secondary | ICD-10-CM

## 2021-09-07 DIAGNOSIS — R079 Chest pain, unspecified: Secondary | ICD-10-CM | POA: Diagnosis not present

## 2021-09-07 DIAGNOSIS — I341 Nonrheumatic mitral (valve) prolapse: Secondary | ICD-10-CM | POA: Diagnosis not present

## 2021-09-07 DIAGNOSIS — F331 Major depressive disorder, recurrent, moderate: Secondary | ICD-10-CM | POA: Diagnosis not present

## 2021-09-07 DIAGNOSIS — G4700 Insomnia, unspecified: Secondary | ICD-10-CM | POA: Diagnosis not present

## 2021-09-07 DIAGNOSIS — F411 Generalized anxiety disorder: Secondary | ICD-10-CM | POA: Diagnosis not present

## 2021-09-07 NOTE — Patient Instructions (Addendum)
Medication Instructions:  Your physician recommends that you continue on your current medications as directed. Please refer to the Current Medication list given to you today.  *If you need a refill on your cardiac medications before your next appointment, please call your pharmacy*   Lab Work: NONE ordered at this time of appointment   If you have labs (blood work) drawn today and your tests are completely normal, you will receive your results only by: MyChart Message (if you have MyChart) OR A paper copy in the mail If you have any lab test that is abnormal or we need to change your treatment, we will call you to review the results.   Testing/Procedures: Your physician has requested that you have a lexiscan myoview. For further information please visit [email or web address] Please follow instruction sheet, as given.  Please schedule for within 1 month   Follow-Up: At [facility], you and your health needs are our priority.  As part of our continuing mission to provide you with exceptional heart care, we have created designated Provider Care Teams.  These Care Teams include your primary Cardiologist (physician) and Advanced Practice Providers (APPs -  Physician Assistants and Nurse Practitioners) who all work together to provide you with the care you need, when you need it.  Your next appointment:   3-4 month(s)  The format for your next appointment:   In Person  Provider:   Parke Poisson, MD    Other Instructions

## 2021-09-09 DIAGNOSIS — F410 Panic disorder [episodic paroxysmal anxiety] without agoraphobia: Secondary | ICD-10-CM | POA: Diagnosis not present

## 2021-09-14 DIAGNOSIS — F410 Panic disorder [episodic paroxysmal anxiety] without agoraphobia: Secondary | ICD-10-CM | POA: Diagnosis not present

## 2021-09-16 DIAGNOSIS — F410 Panic disorder [episodic paroxysmal anxiety] without agoraphobia: Secondary | ICD-10-CM | POA: Diagnosis not present

## 2021-09-24 ENCOUNTER — Encounter (HOSPITAL_COMMUNITY): Payer: BC Managed Care – PPO

## 2021-09-24 DIAGNOSIS — F410 Panic disorder [episodic paroxysmal anxiety] without agoraphobia: Secondary | ICD-10-CM | POA: Diagnosis not present

## 2021-10-06 DIAGNOSIS — G4700 Insomnia, unspecified: Secondary | ICD-10-CM | POA: Diagnosis not present

## 2021-10-06 DIAGNOSIS — F331 Major depressive disorder, recurrent, moderate: Secondary | ICD-10-CM | POA: Diagnosis not present

## 2021-10-06 DIAGNOSIS — F411 Generalized anxiety disorder: Secondary | ICD-10-CM | POA: Diagnosis not present

## 2021-10-06 DIAGNOSIS — R002 Palpitations: Secondary | ICD-10-CM | POA: Diagnosis not present

## 2021-10-13 ENCOUNTER — Ambulatory Visit: Payer: BC Managed Care – PPO | Admitting: Emergency Medicine

## 2021-11-04 DIAGNOSIS — F410 Panic disorder [episodic paroxysmal anxiety] without agoraphobia: Secondary | ICD-10-CM | POA: Diagnosis not present

## 2021-11-29 ENCOUNTER — Encounter: Payer: Self-pay | Admitting: Family Medicine

## 2021-11-29 DIAGNOSIS — D2262 Melanocytic nevi of left upper limb, including shoulder: Secondary | ICD-10-CM | POA: Diagnosis not present

## 2021-11-29 DIAGNOSIS — D2271 Melanocytic nevi of right lower limb, including hip: Secondary | ICD-10-CM | POA: Diagnosis not present

## 2021-11-29 DIAGNOSIS — D485 Neoplasm of uncertain behavior of skin: Secondary | ICD-10-CM | POA: Diagnosis not present

## 2021-11-29 DIAGNOSIS — D225 Melanocytic nevi of trunk: Secondary | ICD-10-CM | POA: Diagnosis not present

## 2021-11-29 DIAGNOSIS — D2272 Melanocytic nevi of left lower limb, including hip: Secondary | ICD-10-CM | POA: Diagnosis not present

## 2021-11-29 DIAGNOSIS — D2261 Melanocytic nevi of right upper limb, including shoulder: Secondary | ICD-10-CM | POA: Diagnosis not present

## 2021-12-27 ENCOUNTER — Ambulatory Visit (INDEPENDENT_AMBULATORY_CARE_PROVIDER_SITE_OTHER): Payer: BC Managed Care – PPO | Admitting: Internal Medicine

## 2021-12-27 ENCOUNTER — Encounter: Payer: Self-pay | Admitting: Internal Medicine

## 2021-12-27 VITALS — BP 114/80 | HR 76 | Ht 66.0 in | Wt 149.6 lb

## 2021-12-27 DIAGNOSIS — I341 Nonrheumatic mitral (valve) prolapse: Secondary | ICD-10-CM

## 2021-12-27 DIAGNOSIS — R079 Chest pain, unspecified: Secondary | ICD-10-CM

## 2021-12-27 DIAGNOSIS — I471 Supraventricular tachycardia: Secondary | ICD-10-CM | POA: Diagnosis not present

## 2021-12-27 DIAGNOSIS — R002 Palpitations: Secondary | ICD-10-CM

## 2021-12-27 DIAGNOSIS — Z79899 Other long term (current) drug therapy: Secondary | ICD-10-CM

## 2021-12-27 NOTE — Patient Instructions (Signed)
Medication Instructions:  No Changes In Medications at this time.  *If you need a refill on your cardiac medications before your next appointment, please call your pharmacy*  Testing/Procedures: Your physician has requested that you have an echocardiogram IN ONE YEAR. Echocardiography is a painless test that uses sound waves to create images of your heart. It provides your doctor with information about the size and shape of your heart and how well your heart's chambers and valves are working. You may receive an ultrasound enhancing agent through an IV if needed to better visualize your heart during the echo.This procedure takes approximately one hour. There are no restrictions for this procedure. This will take place at the [address], [address].   Follow-Up: At [facility], you and your health needs are our priority.  As part of our continuing mission to provide you with exceptional heart care, we have created designated Provider Care Teams.  These Care Teams include your primary Cardiologist (physician) and Advanced Practice Providers (APPs -  Physician Assistants and Nurse Practitioners) who all work together to provide you with the care you need, when you need it.  Your next appointment:   1 year(s)  The format for your next appointment:   In Person  Provider:   Gayatri A Acharya, MD { 

## 2021-12-27 NOTE — Progress Notes (Signed)
?Cardiology Office Note:   ? ?Date:  12/27/2021  ? ?ID:  Beverly Banks, DOB [birth year], MRN [id number] ? ?PCP:  Lewis Moccasin, MD  ?Cardiologist:  Parke Poisson, MD  ?Electrophysiologist:  None  ? ?Referring MD: Lewis Moccasin, MD  ? ?Chief Complaint/Reason for Referral: ?Follow-up ? ?History of Present Illness:   ? ?Beverly Banks is a 46 y.o. female with a history of narrow-complex tachycardia, here for follow-up. ? ?On 08/02/2021 metoprolol was switched to prn propranolol in the setting of significant fatigue on beta-blocker therapy. She was started on Coreg 3.125 BID after she called 08/18/2021 and reported elevated BP readings. ? ?She was last seen in cardiology 09/07/2021 by Bernadene Person, NP for follow-up of chest pain and palpitations. She was feeling better and had been started on Xanax and Zoloft for anxiety by her PCP. There was no recurrent chest tightness and blood pressure was well controlled. Only took propranolol 2-3 times in the month prior for rare palpitations. Due to improvement in symptoms and reassuring monitor results, further cardiac testing was cancelled. ? ?Since her last appointment she reports having "ups and downs" but lately she is feeling more stable. She states she is no longer taking the propranolol or the Coreg. Regarding her diet she successfully made changes. She stopped drinking caffeine and most alcohol. ? 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 ? ?The patient denies chest pain, chest pressure, dyspnea at rest or with exertion, palpitations, PND, orthopnea, or leg swelling. Denies cough, fever, chills. Denies nausea, vomiting. Denies syncope or presyncope. Denies dizziness or lightheadedness. Denies snoring. ? ? ?Past Medical History:  ?Diagnosis Date  ? No known health problems   ? ? ?Past Surgical History:  ?Procedure Laterality Date  ? CESAREAN SECTION    ? TONSILLECTOMY    ? ? ?Current Medications: ?Current Meds  ?Medication Sig  ? ALPRAZolam (XANAX)  0.25 MG tablet 0.25 mg.  ? FINACEA 15 % FOAM Apply 1 g topically daily.  ? sertraline (ZOLOFT) 25 MG tablet Take 25 mg by mouth daily.  ?  ? ?Allergies:   Patient has no known allergies.  ? ?Social History  ? ?Tobacco Use  ? Smoking status: Never  ? Smokeless tobacco: Never  ?Substance Use Topics  ? Alcohol use: Yes  ?  Comment: occ  ? Drug use: No  ?  ? ?Family History: ?The patient's family history includes Hypertension in her mother. ? ?ROS:   ?Please see the history of present illness.    ?All other systems reviewed and are negative. ? ?EKGs/Labs/Other Studies Reviewed:   ? ?The following studies were reviewed today: ? ?Monitor 09/2021: ?Indication: SVT ?  ?Minimum HR (bpm): 44 ?Maximum HR (bpm): 169 ?  ?Supraventricular Ectopy: <1% rare ?SVT: none ?  ?Ventricular Ectopy: <1% rare ?NSVT: none ?Ventricular Tachycardia: none ?  ?Pauses: none ?AV block: rare 1st deg AVB ?  ?Atrial fibrillation: none ?  ?Diary events: symptoms of palpitations, chest pain, lightheaded associated primarily with sinus rhythm, sinus arrhythmia, or mild sinus tachycardia. No significant association with infrequent ectopy. Rare IVCD noted. ?  ?IMPRESSION: symptoms primarily associated with normal sinus rhythm and sinus tachycardia. No SVT. ? ?Echo 07/19/2021: ? 1. Left ventricular ejection fraction, by estimation, is 60 to 65%. The  ?left ventricle has normal function. The left ventricle has no regional  ?wall motion abnormalities. Left ventricular diastolic parameters were  ?normal.  ? 2. Right ventricular systolic function is normal. The right ventricular  ?  size is normal. There is normal pulmonary artery systolic pressure.  ? 3. There is mild thickening of the mitral valve leaflets. No evidence of  ?mitral valve regurgitation. No evidence of mitral stenosis.  ? 4. There is mild thickening of the aortic valve. Aortic valve  ?regurgitation is not visualized. No aortic stenosis is present.  ? 5. The inferior vena cava is normal in size  with greater than 50%  ?respiratory variability, suggesting right atrial pressure of 3 mmHg.  ? ?Comparison(s): No prior Echocardiogram.  ? ? ?EKG: EKG is personally reviewed. ?12/27/2021: EKG was not ordered. ? ?Imaging studies that I have independently reviewed today: n/a ? ?Recent Labs: ?07/19/2021: ALT 18 ?07/31/2021: BUN 14; Creatinine, Ser 0.80; Hemoglobin 13.4; Platelets 246; Potassium 3.8; Sodium 138; TSH 1.516  ? ?Recent Lipid Panel ?No results found for: CHOL, TRIG, HDL, CHOLHDL, VLDL, LDLCALC, LDLDIRECT ? ?Physical Exam:   ? ?VS:  BP 114/80   Pulse 76   Ht 5\' 6"  (1.676 m)   Wt 149 lb 9.6 oz (67.9 kg)   SpO2 100%   BMI 24.15 kg/m?    ? ?Wt Readings from Last 5 Encounters:  ?12/27/21 149 lb 9.6 oz (67.9 kg)  ?09/07/21 143 lb (64.9 kg)  ?08/02/21 145 lb (65.8 kg)  ?05/23/17 136 lb 3.9 oz (61.8 kg)  ?05/12/17 133 lb 4 oz (60.4 kg)  ?  ?Constitutional: No acute distress ?Eyes: sclera non-icteric, normal conjunctiva and lids ?ENMT: normal dentition, moist mucous membranes ?Cardiovascular: regular rhythm, normal rate, no murmur. S1 and S2 normal. No jugular venous distention.  ?Respiratory: clear to auscultation bilaterally ?GI : normal bowel sounds, soft and nontender. No distention.   ?MSK: extremities warm, well perfused. No edema.  ?NEURO: grossly nonfocal exam, moves all extremities. ?PSYCH: alert and oriented x 3, normal mood and affect.  ? ?ASSESSMENT:   ? ?1. Mitral valve prolapse   ?2. SVT (supraventricular tachycardia) (HCC)   ?3. Palpitations   ?4. Chest pain of uncertain etiology   ?5. Medication management   ? ?PLAN:   ? ?Mitral valve prolapse - Plan: ECHOCARDIOGRAM COMPLETE ?- Mild bileaflet mitral valve prolapse with trivial MR. Will recheck at next follow up. Discussed possibility of mild mitral annular disjunction.  ? ?SVT (supraventricular tachycardia) (HCC) ?Palpitations ?Chest pain of uncertain etiology ?Medication management ?- no recurrence after starting SSRI and anxiolytic. Managing  stress better at home as well. Will stop propranolol and Coreg as patient has already discontinued due to not needing.  ? ?Beverly BrassGayatri Berlin Viereck, MD, [facility] Foundation HospitalFACC ?[place]  [facility]  ? ?Shared Decision Making/Informed Consent:   ?   ? ?Medication Adjustments/Labs and Tests Ordered: ?Current medicines are reviewed at length with the patient today.  Concerns regarding medicines are outlined above.  ? ?Orders Placed This Encounter  ?Procedures  ? ECHOCARDIOGRAM COMPLETE  ? ? ?No orders of the defined types were placed in this encounter. ? ? ?Patient Instructions  ?Medication Instructions:  ?No Changes In Medications at this time.  ?*If you need a refill on your cardiac medications before your next appointment, please call your pharmacy* ? ?Testing/Procedures: ?Your physician has requested that you have an echocardiogram IN ONE YEAR. Echocardiography is a painless test that uses sound waves to create images of your heart. It provides your doctor with information about the size and shape of your heart and how well your heart?s chambers and valves are working. You may receive an ultrasound enhancing agent through an IV if needed to better visualize your  heart during the echo.This procedure takes approximately one hour. There are no restrictions for this procedure. This will take place at the [address], [address].  ? ?Follow-Up: ?At [facility], you and your health needs are our priority.  As part of our continuing mission to provide you with exceptional heart care, we have created designated Provider Care Teams.  These Care Teams include your primary Cardiologist (physician) and Advanced Practice Providers (APPs -  Physician Assistants and Nurse Practitioners) who all work together to provide you with the care you need, when you need it. ? ?Your next appointment:   ?1 year(s) ? ?The format for your next appointment:   ?In Person ? ?Provider:   ?Parke Poisson, MD   ? ? ? ? ?   ? ? ?I,Mathew Stumpf,acting as a  scribe for Parke Poisson, MD.,have documented all relevant documentation on the behalf of Parke Poisson, MD,as directed by  Parke Poisson, MD while in the presence of Parke Poisson, MD. ? ?I, [place]

## 2022-01-06 DIAGNOSIS — F410 Panic disorder [episodic paroxysmal anxiety] without agoraphobia: Secondary | ICD-10-CM | POA: Diagnosis not present

## 2022-01-12 DIAGNOSIS — F411 Generalized anxiety disorder: Secondary | ICD-10-CM | POA: Diagnosis not present

## 2022-01-12 DIAGNOSIS — G4700 Insomnia, unspecified: Secondary | ICD-10-CM | POA: Diagnosis not present

## 2022-01-12 DIAGNOSIS — F331 Major depressive disorder, recurrent, moderate: Secondary | ICD-10-CM | POA: Diagnosis not present

## 2022-01-14 DIAGNOSIS — Z114 Encounter for screening for human immunodeficiency virus [HIV]: Secondary | ICD-10-CM | POA: Diagnosis not present

## 2022-01-14 DIAGNOSIS — Z0000 Encounter for general adult medical examination without abnormal findings: Secondary | ICD-10-CM | POA: Diagnosis not present

## 2022-01-17 DIAGNOSIS — Z0000 Encounter for general adult medical examination without abnormal findings: Secondary | ICD-10-CM | POA: Diagnosis not present

## 2022-01-17 DIAGNOSIS — Z23 Encounter for immunization: Secondary | ICD-10-CM | POA: Diagnosis not present

## 2022-01-24 DIAGNOSIS — Z01419 Encounter for gynecological examination (general) (routine) without abnormal findings: Secondary | ICD-10-CM | POA: Diagnosis not present

## 2022-01-24 DIAGNOSIS — Z1231 Encounter for screening mammogram for malignant neoplasm of breast: Secondary | ICD-10-CM | POA: Diagnosis not present

## 2022-01-24 DIAGNOSIS — Z6823 Body mass index (BMI) 23.0-23.9, adult: Secondary | ICD-10-CM | POA: Diagnosis not present

## 2022-02-14 DIAGNOSIS — Z113 Encounter for screening for infections with a predominantly sexual mode of transmission: Secondary | ICD-10-CM | POA: Diagnosis not present

## 2022-02-14 DIAGNOSIS — R87610 Atypical squamous cells of undetermined significance on cytologic smear of cervix (ASC-US): Secondary | ICD-10-CM | POA: Diagnosis not present

## 2022-02-14 DIAGNOSIS — R87612 Low grade squamous intraepithelial lesion on cytologic smear of cervix (LGSIL): Secondary | ICD-10-CM | POA: Diagnosis not present

## 2022-04-05 DIAGNOSIS — D069 Carcinoma in situ of cervix, unspecified: Secondary | ICD-10-CM | POA: Diagnosis not present

## 2022-04-05 DIAGNOSIS — R87619 Unspecified abnormal cytological findings in specimens from cervix uteri: Secondary | ICD-10-CM | POA: Diagnosis not present

## 2022-04-12 DIAGNOSIS — D069 Carcinoma in situ of cervix, unspecified: Secondary | ICD-10-CM | POA: Diagnosis not present

## 2022-04-19 DIAGNOSIS — E559 Vitamin D deficiency, unspecified: Secondary | ICD-10-CM | POA: Diagnosis not present

## 2022-04-19 DIAGNOSIS — B977 Papillomavirus as the cause of diseases classified elsewhere: Secondary | ICD-10-CM | POA: Diagnosis not present

## 2022-04-19 DIAGNOSIS — Z7185 Encounter for immunization safety counseling: Secondary | ICD-10-CM | POA: Diagnosis not present

## 2022-04-19 DIAGNOSIS — Z23 Encounter for immunization: Secondary | ICD-10-CM | POA: Diagnosis not present

## 2022-04-19 DIAGNOSIS — D649 Anemia, unspecified: Secondary | ICD-10-CM | POA: Diagnosis not present

## 2022-04-19 DIAGNOSIS — C539 Malignant neoplasm of cervix uteri, unspecified: Secondary | ICD-10-CM | POA: Diagnosis not present

## 2022-04-25 ENCOUNTER — Other Ambulatory Visit: Payer: Self-pay | Admitting: Obstetrics and Gynecology

## 2022-04-25 DIAGNOSIS — D069 Carcinoma in situ of cervix, unspecified: Secondary | ICD-10-CM | POA: Diagnosis not present

## 2022-04-25 DIAGNOSIS — D060 Carcinoma in situ of endocervix: Secondary | ICD-10-CM | POA: Diagnosis not present

## 2022-05-19 DIAGNOSIS — E559 Vitamin D deficiency, unspecified: Secondary | ICD-10-CM | POA: Diagnosis not present

## 2022-05-19 DIAGNOSIS — Z7185 Encounter for immunization safety counseling: Secondary | ICD-10-CM | POA: Diagnosis not present

## 2022-05-19 DIAGNOSIS — Z23 Encounter for immunization: Secondary | ICD-10-CM | POA: Diagnosis not present

## 2022-05-19 DIAGNOSIS — D649 Anemia, unspecified: Secondary | ICD-10-CM | POA: Diagnosis not present

## 2022-05-19 DIAGNOSIS — R002 Palpitations: Secondary | ICD-10-CM | POA: Diagnosis not present

## 2022-07-21 DIAGNOSIS — R87810 Cervical high risk human papillomavirus (HPV) DNA test positive: Secondary | ICD-10-CM | POA: Diagnosis not present

## 2022-08-16 DIAGNOSIS — Z6825 Body mass index (BMI) 25.0-25.9, adult: Secondary | ICD-10-CM | POA: Diagnosis not present

## 2022-08-16 DIAGNOSIS — Z713 Dietary counseling and surveillance: Secondary | ICD-10-CM | POA: Diagnosis not present

## 2022-09-15 DIAGNOSIS — E559 Vitamin D deficiency, unspecified: Secondary | ICD-10-CM | POA: Diagnosis not present

## 2022-09-15 DIAGNOSIS — D649 Anemia, unspecified: Secondary | ICD-10-CM | POA: Diagnosis not present

## 2022-09-15 DIAGNOSIS — R790 Abnormal level of blood mineral: Secondary | ICD-10-CM | POA: Diagnosis not present

## 2022-09-20 DIAGNOSIS — D509 Iron deficiency anemia, unspecified: Secondary | ICD-10-CM | POA: Diagnosis not present

## 2022-09-20 DIAGNOSIS — E559 Vitamin D deficiency, unspecified: Secondary | ICD-10-CM | POA: Diagnosis not present

## 2022-09-20 DIAGNOSIS — G4700 Insomnia, unspecified: Secondary | ICD-10-CM | POA: Diagnosis not present

## 2022-09-20 DIAGNOSIS — F411 Generalized anxiety disorder: Secondary | ICD-10-CM | POA: Diagnosis not present

## 2022-09-27 DIAGNOSIS — J069 Acute upper respiratory infection, unspecified: Secondary | ICD-10-CM | POA: Diagnosis not present

## 2022-10-13 DIAGNOSIS — R87619 Unspecified abnormal cytological findings in specimens from cervix uteri: Secondary | ICD-10-CM | POA: Diagnosis not present

## 2022-10-13 DIAGNOSIS — R87810 Cervical high risk human papillomavirus (HPV) DNA test positive: Secondary | ICD-10-CM | POA: Diagnosis not present

## 2022-10-13 DIAGNOSIS — R87616 Satisfactory cervical smear but lacking transformation zone: Secondary | ICD-10-CM | POA: Diagnosis not present

## 2022-10-13 DIAGNOSIS — D069 Carcinoma in situ of cervix, unspecified: Secondary | ICD-10-CM | POA: Diagnosis not present

## 2022-10-27 DIAGNOSIS — Z23 Encounter for immunization: Secondary | ICD-10-CM | POA: Diagnosis not present

## 2022-11-04 DIAGNOSIS — Z20822 Contact with and (suspected) exposure to covid-19: Secondary | ICD-10-CM | POA: Diagnosis not present

## 2022-11-04 DIAGNOSIS — R059 Cough, unspecified: Secondary | ICD-10-CM | POA: Diagnosis not present

## 2022-11-04 DIAGNOSIS — H6690 Otitis media, unspecified, unspecified ear: Secondary | ICD-10-CM | POA: Diagnosis not present

## 2022-11-10 DIAGNOSIS — R87619 Unspecified abnormal cytological findings in specimens from cervix uteri: Secondary | ICD-10-CM | POA: Diagnosis not present

## 2022-11-10 DIAGNOSIS — F411 Generalized anxiety disorder: Secondary | ICD-10-CM | POA: Diagnosis not present

## 2022-11-10 DIAGNOSIS — G4700 Insomnia, unspecified: Secondary | ICD-10-CM | POA: Diagnosis not present

## 2022-11-10 DIAGNOSIS — C539 Malignant neoplasm of cervix uteri, unspecified: Secondary | ICD-10-CM | POA: Diagnosis not present

## 2022-11-10 DIAGNOSIS — F331 Major depressive disorder, recurrent, moderate: Secondary | ICD-10-CM | POA: Diagnosis not present

## 2022-11-10 DIAGNOSIS — N858 Other specified noninflammatory disorders of uterus: Secondary | ICD-10-CM | POA: Diagnosis not present

## 2022-11-10 DIAGNOSIS — R87616 Satisfactory cervical smear but lacking transformation zone: Secondary | ICD-10-CM | POA: Diagnosis not present

## 2022-12-21 ENCOUNTER — Telehealth (HOSPITAL_COMMUNITY): Payer: Self-pay | Admitting: Internal Medicine

## 2022-12-21 NOTE — Telephone Encounter (Signed)
Good Morning! Patient called and cancelled echocardiogram for 12/22/22 and did not wish to reschedule. Order will be removed from the echo WQ. If patient calls back to schedule we will reinstate the order. Thank you.

## 2022-12-22 ENCOUNTER — Ambulatory Visit (HOSPITAL_COMMUNITY): Payer: BC Managed Care – PPO

## 2023-02-06 DIAGNOSIS — Z0000 Encounter for general adult medical examination without abnormal findings: Secondary | ICD-10-CM | POA: Diagnosis not present

## 2023-02-06 DIAGNOSIS — Z13220 Encounter for screening for lipoid disorders: Secondary | ICD-10-CM | POA: Diagnosis not present

## 2023-02-06 DIAGNOSIS — Z114 Encounter for screening for human immunodeficiency virus [HIV]: Secondary | ICD-10-CM | POA: Diagnosis not present

## 2023-02-07 DIAGNOSIS — B9689 Other specified bacterial agents as the cause of diseases classified elsewhere: Secondary | ICD-10-CM | POA: Diagnosis not present

## 2023-02-07 DIAGNOSIS — Z1159 Encounter for screening for other viral diseases: Secondary | ICD-10-CM | POA: Diagnosis not present

## 2023-02-07 DIAGNOSIS — J069 Acute upper respiratory infection, unspecified: Secondary | ICD-10-CM | POA: Diagnosis not present

## 2023-02-07 DIAGNOSIS — J329 Chronic sinusitis, unspecified: Secondary | ICD-10-CM | POA: Diagnosis not present

## 2023-02-13 DIAGNOSIS — Z0000 Encounter for general adult medical examination without abnormal findings: Secondary | ICD-10-CM | POA: Diagnosis not present

## 2023-02-19 DIAGNOSIS — R030 Elevated blood-pressure reading, without diagnosis of hypertension: Secondary | ICD-10-CM | POA: Diagnosis not present

## 2023-02-19 DIAGNOSIS — J029 Acute pharyngitis, unspecified: Secondary | ICD-10-CM | POA: Diagnosis not present

## 2023-02-19 DIAGNOSIS — J018 Other acute sinusitis: Secondary | ICD-10-CM | POA: Diagnosis not present

## 2023-02-19 DIAGNOSIS — Z6825 Body mass index (BMI) 25.0-25.9, adult: Secondary | ICD-10-CM | POA: Diagnosis not present

## 2023-02-23 DIAGNOSIS — D069 Carcinoma in situ of cervix, unspecified: Secondary | ICD-10-CM | POA: Diagnosis not present

## 2023-02-23 DIAGNOSIS — Z6825 Body mass index (BMI) 25.0-25.9, adult: Secondary | ICD-10-CM | POA: Diagnosis not present

## 2023-02-23 DIAGNOSIS — Z124 Encounter for screening for malignant neoplasm of cervix: Secondary | ICD-10-CM | POA: Diagnosis not present

## 2023-02-23 DIAGNOSIS — Z01419 Encounter for gynecological examination (general) (routine) without abnormal findings: Secondary | ICD-10-CM | POA: Diagnosis not present

## 2023-02-23 DIAGNOSIS — Z1231 Encounter for screening mammogram for malignant neoplasm of breast: Secondary | ICD-10-CM | POA: Diagnosis not present

## 2023-05-18 DIAGNOSIS — F411 Generalized anxiety disorder: Secondary | ICD-10-CM | POA: Diagnosis not present

## 2023-05-18 DIAGNOSIS — G4700 Insomnia, unspecified: Secondary | ICD-10-CM | POA: Diagnosis not present

## 2023-05-18 DIAGNOSIS — E559 Vitamin D deficiency, unspecified: Secondary | ICD-10-CM | POA: Diagnosis not present

## 2023-05-18 DIAGNOSIS — F331 Major depressive disorder, recurrent, moderate: Secondary | ICD-10-CM | POA: Diagnosis not present

## 2023-06-01 DIAGNOSIS — Z124 Encounter for screening for malignant neoplasm of cervix: Secondary | ICD-10-CM | POA: Diagnosis not present

## 2023-06-01 DIAGNOSIS — D069 Carcinoma in situ of cervix, unspecified: Secondary | ICD-10-CM | POA: Diagnosis not present

## 2023-07-22 IMAGING — CR DG CHEST 2V
2 series · 2 of 2 positions shown · non-contrast
Comparison: Chest x-ray 07/19/2021.

CLINICAL DATA: Chest pain.

EXAM:
CHEST - 2 VIEW

[chest pa]
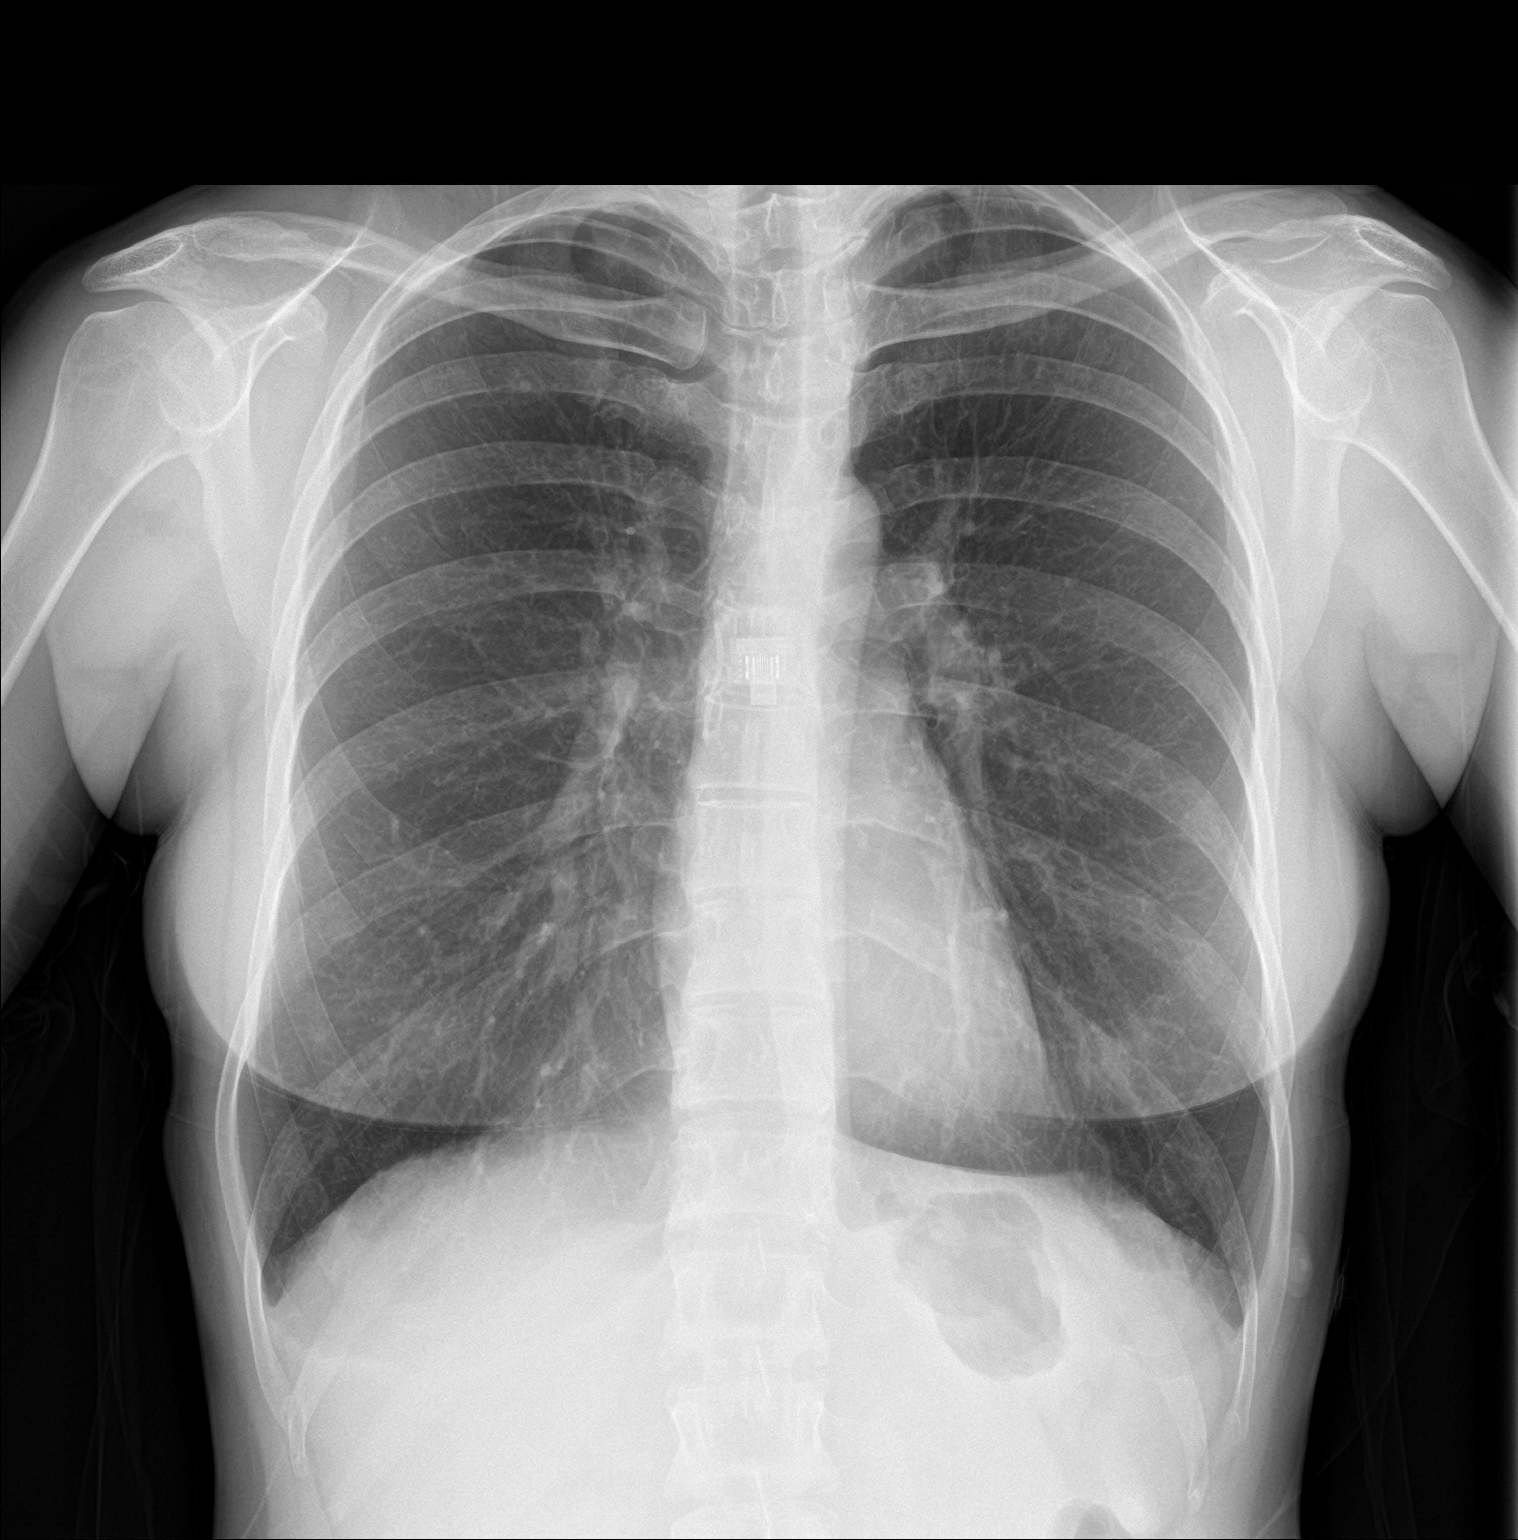

[chest lat]
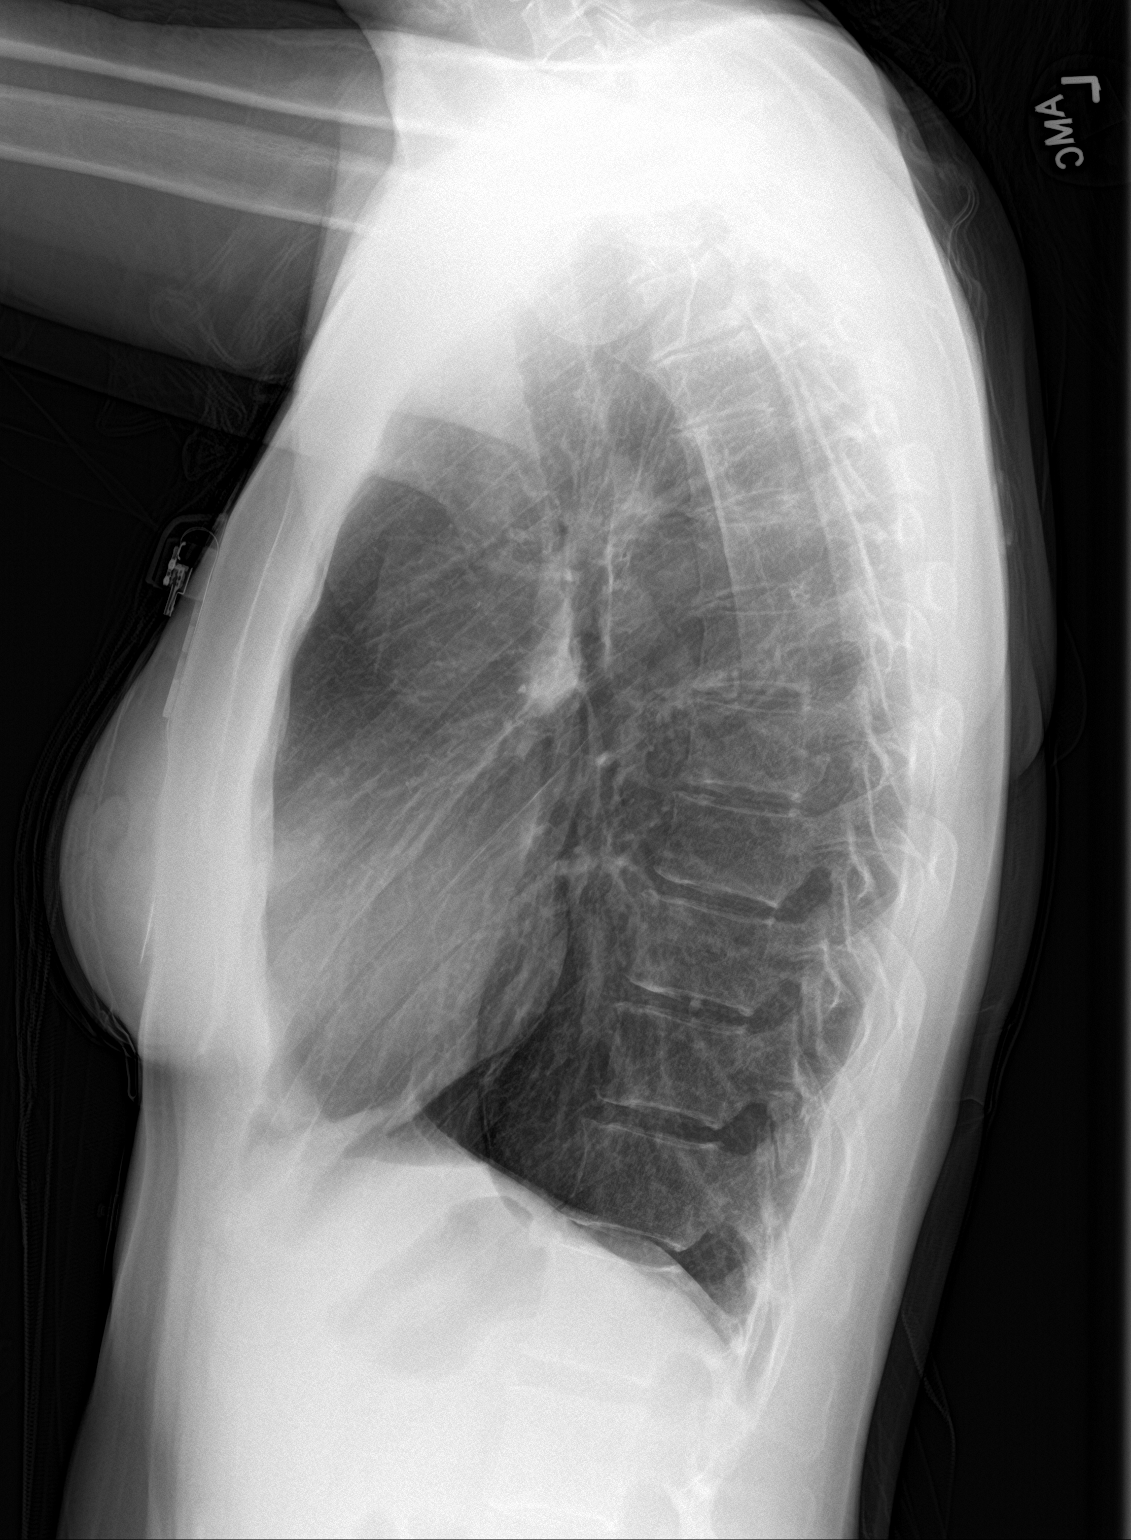

[2 of 2 positions shown; findings below may reference images not displayed]

FINDINGS: The heart size and mediastinal contours are within normal limits.
Both lungs are clear. The visualized skeletal structures are
unremarkable.
IMPRESSION: No active cardiopulmonary disease.

## 2023-07-31 DIAGNOSIS — L718 Other rosacea: Secondary | ICD-10-CM | POA: Diagnosis not present

## 2023-08-08 DIAGNOSIS — D224 Melanocytic nevi of scalp and neck: Secondary | ICD-10-CM | POA: Diagnosis not present

## 2023-08-08 DIAGNOSIS — L821 Other seborrheic keratosis: Secondary | ICD-10-CM | POA: Diagnosis not present

## 2023-08-08 DIAGNOSIS — D485 Neoplasm of uncertain behavior of skin: Secondary | ICD-10-CM | POA: Diagnosis not present

## 2023-08-08 DIAGNOSIS — D225 Melanocytic nevi of trunk: Secondary | ICD-10-CM | POA: Diagnosis not present

## 2023-08-08 DIAGNOSIS — D2261 Melanocytic nevi of right upper limb, including shoulder: Secondary | ICD-10-CM | POA: Diagnosis not present

## 2023-12-13 DIAGNOSIS — Z86001 Personal history of in-situ neoplasm of cervix uteri: Secondary | ICD-10-CM | POA: Diagnosis not present

## 2023-12-13 DIAGNOSIS — Z09 Encounter for follow-up examination after completed treatment for conditions other than malignant neoplasm: Secondary | ICD-10-CM | POA: Diagnosis not present

## 2023-12-21 DIAGNOSIS — G4700 Insomnia, unspecified: Secondary | ICD-10-CM | POA: Diagnosis not present

## 2023-12-21 DIAGNOSIS — E559 Vitamin D deficiency, unspecified: Secondary | ICD-10-CM | POA: Diagnosis not present

## 2023-12-21 DIAGNOSIS — D509 Iron deficiency anemia, unspecified: Secondary | ICD-10-CM | POA: Diagnosis not present

## 2023-12-21 DIAGNOSIS — F411 Generalized anxiety disorder: Secondary | ICD-10-CM | POA: Diagnosis not present

## 2024-02-08 DIAGNOSIS — Z0000 Encounter for general adult medical examination without abnormal findings: Secondary | ICD-10-CM | POA: Diagnosis not present

## 2024-02-08 DIAGNOSIS — Z13220 Encounter for screening for lipoid disorders: Secondary | ICD-10-CM | POA: Diagnosis not present

## 2024-02-08 DIAGNOSIS — E559 Vitamin D deficiency, unspecified: Secondary | ICD-10-CM | POA: Diagnosis not present

## 2024-02-08 DIAGNOSIS — R5383 Other fatigue: Secondary | ICD-10-CM | POA: Diagnosis not present

## 2024-02-16 DIAGNOSIS — H6590 Unspecified nonsuppurative otitis media, unspecified ear: Secondary | ICD-10-CM | POA: Diagnosis not present

## 2024-02-16 DIAGNOSIS — Z0000 Encounter for general adult medical examination without abnormal findings: Secondary | ICD-10-CM | POA: Diagnosis not present

## 2024-04-18 DIAGNOSIS — Z23 Encounter for immunization: Secondary | ICD-10-CM | POA: Diagnosis not present

## 2024-04-18 DIAGNOSIS — Z1211 Encounter for screening for malignant neoplasm of colon: Secondary | ICD-10-CM | POA: Diagnosis not present

## 2024-04-18 DIAGNOSIS — Z7185 Encounter for immunization safety counseling: Secondary | ICD-10-CM | POA: Diagnosis not present

## 2024-04-18 DIAGNOSIS — J309 Allergic rhinitis, unspecified: Secondary | ICD-10-CM | POA: Diagnosis not present

## 2024-04-18 DIAGNOSIS — H9190 Unspecified hearing loss, unspecified ear: Secondary | ICD-10-CM | POA: Diagnosis not present

## 2024-05-24 DIAGNOSIS — Z1211 Encounter for screening for malignant neoplasm of colon: Secondary | ICD-10-CM | POA: Diagnosis not present

## 2024-05-24 DIAGNOSIS — K635 Polyp of colon: Secondary | ICD-10-CM | POA: Diagnosis not present

## 2024-05-24 DIAGNOSIS — K644 Residual hemorrhoidal skin tags: Secondary | ICD-10-CM | POA: Diagnosis not present

## 2024-05-24 DIAGNOSIS — D123 Benign neoplasm of transverse colon: Secondary | ICD-10-CM | POA: Diagnosis not present

## 2024-05-24 DIAGNOSIS — K648 Other hemorrhoids: Secondary | ICD-10-CM | POA: Diagnosis not present

## 2024-06-06 DIAGNOSIS — Z01419 Encounter for gynecological examination (general) (routine) without abnormal findings: Secondary | ICD-10-CM | POA: Diagnosis not present

## 2024-06-06 DIAGNOSIS — Z124 Encounter for screening for malignant neoplasm of cervix: Secondary | ICD-10-CM | POA: Diagnosis not present

## 2024-06-06 DIAGNOSIS — Z6825 Body mass index (BMI) 25.0-25.9, adult: Secondary | ICD-10-CM | POA: Diagnosis not present

## 2024-06-06 DIAGNOSIS — Z1231 Encounter for screening mammogram for malignant neoplasm of breast: Secondary | ICD-10-CM | POA: Diagnosis not present

## 2024-06-12 ENCOUNTER — Encounter: Payer: Self-pay | Admitting: Obstetrics and Gynecology

## 2024-06-12 ENCOUNTER — Other Ambulatory Visit: Payer: Self-pay | Admitting: Obstetrics and Gynecology

## 2024-06-12 DIAGNOSIS — R928 Other abnormal and inconclusive findings on diagnostic imaging of breast: Secondary | ICD-10-CM

## 2024-06-20 ENCOUNTER — Other Ambulatory Visit: Payer: Self-pay | Admitting: Obstetrics and Gynecology

## 2024-06-20 ENCOUNTER — Ambulatory Visit
Admission: RE | Admit: 2024-06-20 | Discharge: 2024-06-20 | Disposition: A | Discharge status: Home / Self Care | Source: Ambulatory Visit | Attending: Obstetrics and Gynecology | Admitting: Obstetrics and Gynecology

## 2024-06-20 DIAGNOSIS — R928 Other abnormal and inconclusive findings on diagnostic imaging of breast: Secondary | ICD-10-CM

## 2024-06-20 DIAGNOSIS — R599 Enlarged lymph nodes, unspecified: Secondary | ICD-10-CM | POA: Diagnosis not present

## 2024-06-27 ENCOUNTER — Ambulatory Visit
Admission: RE | Admit: 2024-06-27 | Discharge: 2024-06-27 | Disposition: A | Source: Ambulatory Visit | Attending: Obstetrics and Gynecology | Admitting: Obstetrics and Gynecology

## 2024-06-27 DIAGNOSIS — R928 Other abnormal and inconclusive findings on diagnostic imaging of breast: Secondary | ICD-10-CM

## 2024-06-27 DIAGNOSIS — D1809 Hemangioma of other sites: Secondary | ICD-10-CM | POA: Diagnosis not present

## 2024-06-27 DIAGNOSIS — R590 Localized enlarged lymph nodes: Secondary | ICD-10-CM | POA: Diagnosis not present

## 2024-06-28 LAB — SURGICAL PATHOLOGY

## 2024-08-22 DIAGNOSIS — D225 Melanocytic nevi of trunk: Secondary | ICD-10-CM | POA: Diagnosis not present

## 2024-08-22 DIAGNOSIS — D2261 Melanocytic nevi of right upper limb, including shoulder: Secondary | ICD-10-CM | POA: Diagnosis not present

## 2024-08-22 DIAGNOSIS — D2271 Melanocytic nevi of right lower limb, including hip: Secondary | ICD-10-CM | POA: Diagnosis not present

## 2024-08-22 DIAGNOSIS — D2272 Melanocytic nevi of left lower limb, including hip: Secondary | ICD-10-CM | POA: Diagnosis not present

## 2024-08-22 DIAGNOSIS — D485 Neoplasm of uncertain behavior of skin: Secondary | ICD-10-CM | POA: Diagnosis not present
